# Patient Record
Sex: Male | Born: 1975 | Race: Black or African American | Hispanic: No | Smoking: Current every day smoker
Health system: Southern US, Community
[De-identification: ages and names within clinical notes are randomized; demographics above are authoritative.]

## PROBLEM LIST (undated history)

## (undated) DIAGNOSIS — E119 Type 2 diabetes mellitus without complications: Secondary | ICD-10-CM

## (undated) DIAGNOSIS — Z9109 Other allergy status, other than to drugs and biological substances: Secondary | ICD-10-CM

## (undated) DIAGNOSIS — R51 Headache: Secondary | ICD-10-CM

## (undated) DIAGNOSIS — R519 Headache, unspecified: Secondary | ICD-10-CM

## (undated) HISTORY — DX: Type 2 diabetes mellitus without complications: E11.9

## (undated) HISTORY — PX: HAND SURGERY: SHX662

---

## 1999-02-01 ENCOUNTER — Encounter: Payer: Self-pay | Admitting: Emergency Medicine

## 1999-02-01 ENCOUNTER — Emergency Department (HOSPITAL_COMMUNITY): Admission: EM | Admit: 1999-02-01 | Discharge: 1999-02-01 | Payer: Self-pay | Admitting: Emergency Medicine

## 1999-02-15 ENCOUNTER — Emergency Department (HOSPITAL_COMMUNITY): Admission: EM | Admit: 1999-02-15 | Discharge: 1999-02-15 | Payer: Self-pay | Admitting: Emergency Medicine

## 1999-02-15 ENCOUNTER — Encounter: Payer: Self-pay | Admitting: Emergency Medicine

## 1999-02-27 ENCOUNTER — Emergency Department (HOSPITAL_COMMUNITY): Admission: EM | Admit: 1999-02-27 | Discharge: 1999-02-27 | Payer: Self-pay | Admitting: Emergency Medicine

## 1999-05-08 ENCOUNTER — Emergency Department (HOSPITAL_COMMUNITY): Admission: EM | Admit: 1999-05-08 | Discharge: 1999-05-08 | Payer: Self-pay | Admitting: Emergency Medicine

## 1999-07-10 ENCOUNTER — Emergency Department (HOSPITAL_COMMUNITY): Admission: EM | Admit: 1999-07-10 | Discharge: 1999-07-10 | Payer: Self-pay | Admitting: Emergency Medicine

## 1999-08-26 ENCOUNTER — Emergency Department (HOSPITAL_COMMUNITY): Admission: EM | Admit: 1999-08-26 | Discharge: 1999-08-26 | Payer: Self-pay | Admitting: Emergency Medicine

## 1999-11-25 ENCOUNTER — Emergency Department (HOSPITAL_COMMUNITY): Admission: EM | Admit: 1999-11-25 | Discharge: 1999-11-25 | Payer: Self-pay | Admitting: Emergency Medicine

## 2000-06-27 ENCOUNTER — Emergency Department (HOSPITAL_COMMUNITY): Admission: EM | Admit: 2000-06-27 | Discharge: 2000-06-27 | Payer: Self-pay | Admitting: Emergency Medicine

## 2000-06-27 ENCOUNTER — Encounter: Payer: Self-pay | Admitting: Emergency Medicine

## 2000-10-17 ENCOUNTER — Encounter: Payer: Self-pay | Admitting: Emergency Medicine

## 2000-10-17 ENCOUNTER — Emergency Department (HOSPITAL_COMMUNITY): Admission: EM | Admit: 2000-10-17 | Discharge: 2000-10-18 | Payer: Self-pay | Admitting: *Deleted

## 2000-10-20 ENCOUNTER — Ambulatory Visit (HOSPITAL_BASED_OUTPATIENT_CLINIC_OR_DEPARTMENT_OTHER): Admission: RE | Admit: 2000-10-20 | Discharge: 2000-10-20 | Payer: Self-pay | Admitting: *Deleted

## 2000-12-25 ENCOUNTER — Emergency Department (HOSPITAL_COMMUNITY): Admission: EM | Admit: 2000-12-25 | Discharge: 2000-12-25 | Payer: Self-pay | Admitting: Emergency Medicine

## 2001-07-17 ENCOUNTER — Emergency Department (HOSPITAL_COMMUNITY): Admission: EM | Admit: 2001-07-17 | Discharge: 2001-07-17 | Payer: Self-pay | Admitting: *Deleted

## 2001-09-19 ENCOUNTER — Emergency Department (HOSPITAL_COMMUNITY): Admission: EM | Admit: 2001-09-19 | Discharge: 2001-09-19 | Payer: Self-pay | Admitting: Emergency Medicine

## 2002-03-24 ENCOUNTER — Emergency Department (HOSPITAL_COMMUNITY): Admission: EM | Admit: 2002-03-24 | Discharge: 2002-03-24 | Payer: Self-pay | Admitting: Emergency Medicine

## 2002-03-27 ENCOUNTER — Encounter: Payer: Self-pay | Admitting: Emergency Medicine

## 2002-03-27 ENCOUNTER — Emergency Department (HOSPITAL_COMMUNITY): Admission: EM | Admit: 2002-03-27 | Discharge: 2002-03-27 | Payer: Self-pay | Admitting: *Deleted

## 2002-07-08 ENCOUNTER — Emergency Department (HOSPITAL_COMMUNITY): Admission: EM | Admit: 2002-07-08 | Discharge: 2002-07-08 | Payer: Self-pay | Admitting: Emergency Medicine

## 2003-01-06 ENCOUNTER — Emergency Department (HOSPITAL_COMMUNITY): Admission: EM | Admit: 2003-01-06 | Discharge: 2003-01-06 | Payer: Self-pay | Admitting: Emergency Medicine

## 2003-01-06 ENCOUNTER — Encounter: Payer: Self-pay | Admitting: Emergency Medicine

## 2003-04-09 ENCOUNTER — Encounter: Payer: Self-pay | Admitting: Emergency Medicine

## 2003-04-09 ENCOUNTER — Emergency Department (HOSPITAL_COMMUNITY): Admission: EM | Admit: 2003-04-09 | Discharge: 2003-04-09 | Payer: Self-pay | Admitting: Emergency Medicine

## 2003-04-26 ENCOUNTER — Emergency Department (HOSPITAL_COMMUNITY): Admission: EM | Admit: 2003-04-26 | Discharge: 2003-04-26 | Payer: Self-pay | Admitting: Emergency Medicine

## 2004-03-24 ENCOUNTER — Emergency Department (HOSPITAL_COMMUNITY): Admission: EM | Admit: 2004-03-24 | Discharge: 2004-03-24 | Payer: Self-pay | Admitting: Emergency Medicine

## 2004-07-25 ENCOUNTER — Emergency Department (HOSPITAL_COMMUNITY): Admission: EM | Admit: 2004-07-25 | Discharge: 2004-07-25 | Payer: Self-pay | Admitting: *Deleted

## 2005-02-02 ENCOUNTER — Emergency Department (HOSPITAL_COMMUNITY): Admission: EM | Admit: 2005-02-02 | Discharge: 2005-02-02 | Payer: Self-pay | Admitting: Emergency Medicine

## 2005-03-05 ENCOUNTER — Emergency Department (HOSPITAL_COMMUNITY): Admission: EM | Admit: 2005-03-05 | Discharge: 2005-03-05 | Payer: Self-pay | Admitting: Emergency Medicine

## 2007-03-06 ENCOUNTER — Emergency Department (HOSPITAL_COMMUNITY): Admission: EM | Admit: 2007-03-06 | Discharge: 2007-03-07 | Payer: Self-pay | Admitting: Emergency Medicine

## 2007-12-25 ENCOUNTER — Emergency Department (HOSPITAL_COMMUNITY): Admission: EM | Admit: 2007-12-25 | Discharge: 2007-12-25 | Payer: Self-pay | Admitting: Emergency Medicine

## 2009-03-12 ENCOUNTER — Emergency Department (HOSPITAL_COMMUNITY): Admission: EM | Admit: 2009-03-12 | Discharge: 2009-03-12 | Payer: Self-pay | Admitting: Emergency Medicine

## 2010-01-17 ENCOUNTER — Emergency Department (HOSPITAL_COMMUNITY): Admission: EM | Admit: 2010-01-17 | Discharge: 2010-01-17 | Payer: Self-pay | Admitting: Family Medicine

## 2010-01-22 ENCOUNTER — Emergency Department (HOSPITAL_COMMUNITY): Admission: EM | Admit: 2010-01-22 | Discharge: 2010-01-23 | Payer: Self-pay | Admitting: Emergency Medicine

## 2010-10-23 ENCOUNTER — Emergency Department (HOSPITAL_COMMUNITY)
Admission: EM | Admit: 2010-10-23 | Discharge: 2010-10-23 | Payer: Self-pay | Source: Home / Self Care | Admitting: Emergency Medicine

## 2011-04-12 NOTE — Op Note (Signed)
[place]  Patient:    Herbert Barnes, Herbert Barnes                     MRN: [id number] Proc. Date: 10/20/00 Adm. Date:  [id number] Attending:  Kendell Bane                           Operative Report  PREOPERATIVE DIAGNOSIS:  Laceration, extensor tendons, right ring and small fingers, dorsum of right hand.  POSTOPERATIVE DIAGNOSIS:  Laceration, extensor tendons, right ring and small fingers, dorsum of right hand.  OPERATION:  Repair of extensor tendons, right ring and small fingers, dorsum of right hand.  SURGEON:  Lowell Bouton, M.D.  ANESTHESIA:  General.  OPERATIVE FINDINGS:  The patient had a complete transection of the two extensor tendons to the small finger and one of the extensor tendons to the right finger.  The wound was grossly contaminated with fiberglass materials from a wall that he struck.  The periosteum was lacerated on the small finger metacarpal with a dorsal crack in the bone.  DESCRIPTION OF PROCEDURE:  Under general anesthesia with the tourniquet on the right arm, the right hand was prepped and draped in the usual fashion.  After exsanguinating the limb, the tourniquet was inflated to 225 mmHg.  The previous sutures were removed, and the wound edges were debrided.  The wound was extended proximally in a zigzag fashion and flap elevated radially.  The wound was contaminated with little white flecks of fiberglass from the wall, and this was debrided meticulously.  The wound was irrigated copiously with saline.  The dorsal periosteum on the fifth metacarpal was disrupted and was repaired with a 3-0 Ethibond suture.  There was a small cortical crack in the fifth metacarpal dorsally.  The fracture was not unstable.  The extensor tendons were identified proximally and distally, and the two extensor tendons to the small finger were repaired with a 3-0 Ethibond in a Kessler fashion, and the ulnar-most extensor  tendon of the ring finger was repaired with 3-0 Ethibond in a Kessler fashion.  The repairs were reinforced with simple sutures using Ethibond.  The wound was again copiously irrigated.  Bleeding was controlled with electrocautery.  The skin was closed over a vessel loop drain using a 4-0 nylon suture.  Sterile dressings were applied followed by a volar wrist splint with the ulnar two digits protected.  The patient tolerated the procedure well and went to the recovery room awake, stable, and in good condition. DD:  10/20/00 TD:  10/20/00 Job: [id number]

## 2011-11-26 ENCOUNTER — Emergency Department (HOSPITAL_COMMUNITY)
Admission: EM | Admit: 2011-11-26 | Discharge: 2011-11-26 | Disposition: A | Discharge status: Home / Self Care | Payer: Self-pay | Attending: Emergency Medicine | Admitting: Emergency Medicine

## 2011-11-26 ENCOUNTER — Encounter: Payer: Self-pay | Admitting: Emergency Medicine

## 2011-11-26 DIAGNOSIS — R220 Localized swelling, mass and lump, head: Secondary | ICD-10-CM | POA: Insufficient documentation

## 2011-11-26 DIAGNOSIS — F17200 Nicotine dependence, unspecified, uncomplicated: Secondary | ICD-10-CM | POA: Insufficient documentation

## 2011-11-26 DIAGNOSIS — K1370 Unspecified lesions of oral mucosa: Secondary | ICD-10-CM | POA: Insufficient documentation

## 2011-11-26 DIAGNOSIS — K029 Dental caries, unspecified: Secondary | ICD-10-CM | POA: Insufficient documentation

## 2011-11-26 DIAGNOSIS — K089 Disorder of teeth and supporting structures, unspecified: Secondary | ICD-10-CM | POA: Insufficient documentation

## 2011-11-26 MED ORDER — HYDROCODONE-ACETAMINOPHEN 5-500 MG PO TABS: 1.0000 | ORAL_TABLET | Freq: Four times a day (QID) | ORAL | Status: AC | PRN

## 2011-11-26 MED ORDER — PENICILLIN V POTASSIUM 500 MG PO TABS: 500.0000 mg | ORAL_TABLET | Freq: Four times a day (QID) | ORAL | Status: AC

## 2011-11-26 NOTE — ED Notes (Signed)
Per Pt: tooth in left lower jaw has massive carie and severe pain; part of tooth chip away 3-4 months ago, pt has not seen a dentist, pt has no signs of infection that pt has noticed (no swelling, no fever); pt has been using ibuprofen and orajel without relief. 10/10 pain reported, worse with eating.

## 2011-11-26 NOTE — ED Provider Notes (Signed)
History     CSN: [id number]  Arrival date & time 11/26/11  1609   First MD Initiated Contact with Patient 11/26/11 1757      Chief Complaint  Patient presents with  . Dental Pain    LT lower tooth chipped.      Patient is a 36 y.o. male presenting with tooth pain.  Dental PainPrimary symptoms do not include mouth pain, dental injury, oral lesions, headaches, fever, shortness of breath, sore throat or cough.  Additional symptoms do not include: dental sensitivity to temperature, gum swelling, gum tenderness, jaw pain, facial swelling, trouble swallowing, pain with swallowing, dry mouth, taste disturbance, smell disturbance, drooling, ear pain, hearing loss, nosebleeds, swollen glands, goiter and fatigue.    History reviewed. No pertinent past medical history.  Past Surgical History  Procedure Date  . Hand surgery     History reviewed. No pertinent family history.  History  Substance Use Topics  . Smoking status: Current Everyday Smoker -- 1.5 packs/day  . Smokeless tobacco: Not on file  . Alcohol Use: Yes     ocassional      Review of Systems  Constitutional: Negative for fever, chills, diaphoresis, appetite change and fatigue.  HENT: Negative for hearing loss, ear pain, nosebleeds, sore throat, facial swelling, drooling, trouble swallowing and neck pain.   Eyes: Negative for photophobia and visual disturbance.  Respiratory: Negative for cough, chest tightness and shortness of breath.   Cardiovascular: Negative for chest pain.  Gastrointestinal: Negative for nausea, vomiting and abdominal pain.  Genitourinary: Negative for flank pain.  Musculoskeletal: Negative for back pain.  Skin: Negative for rash.  Neurological: Negative for weakness, numbness and headaches.  All other systems reviewed and are negative.    Allergies  Review of patient's allergies indicates no known allergies.  Home Medications   Current Outpatient Rx  Name Route Sig Dispense Refill  .  ACETAMINOPHEN 500 MG PO TABS Oral Take 1,500 mg by mouth every 6 (six) hours as needed. pain     . PSEUDOEPHEDRINE HCL 30 MG PO TABS Oral Take 30 mg by mouth every 4 (four) hours as needed. Pressure and pain....        BP 119/77  Pulse 65  Temp(Src) 98.5 F (36.9 C) (Oral)  Resp 18  SpO2 100%  Physical Exam  Nursing note and vitals reviewed. Constitutional: He is oriented to person, place, and time. He appears well-developed and well-nourished. No distress.  HENT:  Head: Normocephalic and atraumatic. No trismus in the jaw.  Mouth/Throat: Oropharynx is clear and moist. Oral lesions present. Dental caries present. No dental abscesses or uvula swelling.       No swelling under the tongue. No abscess. Dental caries noted to the left second molar lower.  Eyes: EOM are normal. Pupils are equal, round, and reactive to light.  Neck: Normal range of motion. Neck supple.  Cardiovascular: Normal rate, regular rhythm, normal heart sounds and intact distal pulses.  Exam reveals no gallop and no friction rub.   No murmur heard. Pulmonary/Chest: Effort normal and breath sounds normal. No respiratory distress. He has no wheezes. He exhibits no tenderness.  Abdominal: Soft. Bowel sounds are normal. There is no tenderness. There is no rebound and no guarding.  Musculoskeletal: Normal range of motion. He exhibits no edema and no tenderness.  Neurological: He is alert and oriented to person, place, and time. He displays normal reflexes. No cranial nerve deficit or sensory deficit. He exhibits normal muscle tone. He displays a  negative Romberg sign. Coordination and gait normal. GCS eye subscore is 4. GCS verbal subscore is 5. GCS motor subscore is 6. He displays no Babinski's sign on the right side. He displays no Babinski's sign on the left side.       Normal finger to nose testing. Normal grip. No pronator drift. No nystagmus on examination.  Skin: Skin is warm and dry. No rash noted. He is not  diaphoretic. No erythema. No pallor.  Psychiatric: He has a normal mood and affect. His behavior is normal. Judgment and thought content normal.    ED Course  Procedures (including critical care time)  Patient seen and evaluated.  VSS reviewed. . Nursing notes reviewed.  Initial testing ordered. Will monitor the patient closely. They agree with the treatment plan and diagnosis.    MDM  Dental pain Dental caries, no swelling. No trismus. No concern for deeper infection at this time. Advised patient of warning signs to return. Stated agreement and understanding.   Demetrius Charity, PA 11/26/11 [phone]

## 2011-11-27 NOTE — ED Provider Notes (Signed)
Medical screening examination/treatment/procedure(s) were performed by non-physician practitioner and as supervising physician I was immediately available for consultation/collaboration.   Lyanne Co, MD 11/27/11 [phone]

## 2015-03-08 ENCOUNTER — Encounter (HOSPITAL_COMMUNITY): Payer: Self-pay | Admitting: Emergency Medicine

## 2015-03-08 ENCOUNTER — Inpatient Hospital Stay (HOSPITAL_COMMUNITY)
Admission: EM | Admit: 2015-03-08 | Discharge: 2015-03-09 | DRG: 637 | Disposition: A | Discharge status: Home / Self Care | Payer: Self-pay | Attending: Internal Medicine | Admitting: Internal Medicine

## 2015-03-08 ENCOUNTER — Emergency Department (HOSPITAL_COMMUNITY): Payer: Self-pay

## 2015-03-08 DIAGNOSIS — F17210 Nicotine dependence, cigarettes, uncomplicated: Secondary | ICD-10-CM | POA: Diagnosis present

## 2015-03-08 DIAGNOSIS — R739 Hyperglycemia, unspecified: Secondary | ICD-10-CM | POA: Diagnosis present

## 2015-03-08 DIAGNOSIS — R410 Disorientation, unspecified: Secondary | ICD-10-CM

## 2015-03-08 DIAGNOSIS — R55 Syncope and collapse: Secondary | ICD-10-CM | POA: Diagnosis present

## 2015-03-08 DIAGNOSIS — E119 Type 2 diabetes mellitus without complications: Secondary | ICD-10-CM | POA: Insufficient documentation

## 2015-03-08 DIAGNOSIS — E1165 Type 2 diabetes mellitus with hyperglycemia: Principal | ICD-10-CM | POA: Diagnosis present

## 2015-03-08 DIAGNOSIS — Z833 Family history of diabetes mellitus: Secondary | ICD-10-CM

## 2015-03-08 DIAGNOSIS — R05 Cough: Secondary | ICD-10-CM

## 2015-03-08 DIAGNOSIS — E860 Dehydration: Secondary | ICD-10-CM | POA: Diagnosis present

## 2015-03-08 DIAGNOSIS — R059 Cough, unspecified: Secondary | ICD-10-CM

## 2015-03-08 DIAGNOSIS — E876 Hypokalemia: Secondary | ICD-10-CM | POA: Diagnosis present

## 2015-03-08 DIAGNOSIS — E1100 Type 2 diabetes mellitus with hyperosmolarity without nonketotic hyperglycemic-hyperosmolar coma (NKHHC): Secondary | ICD-10-CM | POA: Diagnosis present

## 2015-03-08 HISTORY — DX: Headache, unspecified: R51.9

## 2015-03-08 HISTORY — DX: Headache: R51

## 2015-03-08 HISTORY — DX: Other allergy status, other than to drugs and biological substances: Z91.09

## 2015-03-08 LAB — BLOOD GAS, VENOUS
ACID-BASE DEFICIT: 2.2 mmol/L — AB (ref 0.0–2.0)
BICARBONATE: 23 meq/L (ref 20.0–24.0)
FIO2: 0.21 %
O2 SAT: 59.5 %
PATIENT TEMPERATURE: 98.6
TCO2: 20.5 mmol/L (ref 0–100)
pCO2, Ven: 42.9 mmHg — ABNORMAL LOW (ref 45.0–50.0)
pH, Ven: 7.348 — ABNORMAL HIGH (ref 7.250–7.300)
pO2, Ven: 30.6 mmHg (ref 30.0–45.0)

## 2015-03-08 LAB — URINE MICROSCOPIC-ADD ON

## 2015-03-08 LAB — CBC WITH DIFFERENTIAL/PLATELET
BASOS ABS: 0 10*3/uL (ref 0.0–0.1)
BASOS PCT: 0 % (ref 0–1)
EOS ABS: 0.1 10*3/uL (ref 0.0–0.7)
EOS PCT: 1 % (ref 0–5)
HCT: 45.1 % (ref 39.0–52.0)
Hemoglobin: 15.4 g/dL (ref 13.0–17.0)
LYMPHS ABS: 4.1 10*3/uL — AB (ref 0.7–4.0)
LYMPHS PCT: 47 % — AB (ref 12–46)
MCH: 28.3 pg (ref 26.0–34.0)
MCHC: 34.1 g/dL (ref 30.0–36.0)
MCV: 82.8 fL (ref 78.0–100.0)
MONO ABS: 0.6 10*3/uL (ref 0.1–1.0)
MONOS PCT: 6 % (ref 3–12)
NEUTROS ABS: 4 10*3/uL (ref 1.7–7.7)
Neutrophils Relative %: 46 % (ref 43–77)
Platelets: 217 10*3/uL (ref 150–400)
RBC: 5.45 MIL/uL (ref 4.22–5.81)
RDW: 13.2 % (ref 11.5–15.5)
WBC: 8.7 10*3/uL (ref 4.0–10.5)

## 2015-03-08 LAB — COMPREHENSIVE METABOLIC PANEL
ALBUMIN: 4.5 g/dL (ref 3.5–5.2)
ALK PHOS: 86 U/L (ref 39–117)
ALT: 17 U/L (ref 0–53)
AST: 23 U/L (ref 0–37)
Anion gap: 14 (ref 5–15)
BILIRUBIN TOTAL: 1.4 mg/dL — AB (ref 0.3–1.2)
BUN: 14 mg/dL (ref 6–23)
CO2: 21 mmol/L (ref 19–32)
Calcium: 9 mg/dL (ref 8.4–10.5)
Chloride: 96 mmol/L (ref 96–112)
Creatinine, Ser: 1.28 mg/dL (ref 0.50–1.35)
GFR calc Af Amer: 81 mL/min — ABNORMAL LOW (ref >90)
GFR calc non Af Amer: 70 mL/min — ABNORMAL LOW (ref >90)
Glucose, Bld: 479 mg/dL — ABNORMAL HIGH (ref 70–99)
Potassium: 4.6 mmol/L (ref 3.5–5.1)
Sodium: 131 mmol/L — ABNORMAL LOW (ref 135–145)
TOTAL PROTEIN: 7.9 g/dL (ref 6.0–8.3)

## 2015-03-08 LAB — URINALYSIS, ROUTINE W REFLEX MICROSCOPIC
Bilirubin Urine: NEGATIVE
HGB URINE DIPSTICK: NEGATIVE
Ketones, ur: 40 mg/dL — AB
Leukocytes, UA: NEGATIVE
Nitrite: NEGATIVE
PROTEIN: NEGATIVE mg/dL
Specific Gravity, Urine: 1.045 — ABNORMAL HIGH (ref 1.005–1.030)
Urobilinogen, UA: 0.2 mg/dL (ref 0.0–1.0)
pH: 5 (ref 5.0–8.0)

## 2015-03-08 LAB — GLUCOSE, CAPILLARY
GLUCOSE-CAPILLARY: 337 mg/dL — AB (ref 70–99)
GLUCOSE-CAPILLARY: 310 mg/dL — AB (ref 70–99)

## 2015-03-08 LAB — CBG MONITORING, ED
GLUCOSE-CAPILLARY: 502 mg/dL — AB (ref 70–99)
Glucose-Capillary: 469 mg/dL — ABNORMAL HIGH (ref 70–99)

## 2015-03-08 LAB — I-STAT TROPONIN, ED: TROPONIN I, POC: 0 ng/mL (ref 0.00–0.08)

## 2015-03-08 MED ORDER — SODIUM CHLORIDE 0.9 % IV BOLUS (SEPSIS)
1000.0000 mL | INTRAVENOUS | Status: AC
  Administered 2015-03-08: 1000 mL via INTRAVENOUS

## 2015-03-08 MED ORDER — ACETAMINOPHEN 650 MG RE SUPP: 650.0000 mg | Freq: Four times a day (QID) | RECTAL | Status: DC | PRN

## 2015-03-08 MED ORDER — SODIUM CHLORIDE 0.9 % IV SOLN
INTRAVENOUS | Status: DC
  Filled 2015-03-08: qty 2.5

## 2015-03-08 MED ORDER — SODIUM CHLORIDE 0.9 % IV SOLN
INTRAVENOUS | Status: DC
  Filled 2015-03-08 (×2): qty 2.5

## 2015-03-08 MED ORDER — SODIUM CHLORIDE 0.9 % IJ SOLN
3.0000 mL | Freq: Two times a day (BID) | INTRAMUSCULAR | Status: DC
  Administered 2015-03-08: 3 mL via INTRAVENOUS

## 2015-03-08 MED ORDER — ACETAMINOPHEN 325 MG PO TABS: 650.0000 mg | ORAL_TABLET | Freq: Four times a day (QID) | ORAL | Status: DC | PRN

## 2015-03-08 MED ORDER — INSULIN REGULAR BOLUS VIA INFUSION
0.0000 [IU] | Freq: Three times a day (TID) | INTRAVENOUS | Status: DC
  Filled 2015-03-08: qty 10

## 2015-03-08 MED ORDER — ASPIRIN EC 81 MG PO TBEC
81.0000 mg | DELAYED_RELEASE_TABLET | Freq: Every day | ORAL | Status: DC
  Administered 2015-03-08 – 2015-03-09 (×2): 81 mg via ORAL
  Filled 2015-03-08 (×2): qty 1

## 2015-03-08 MED ORDER — ONDANSETRON HCL 4 MG PO TABS: 4.0000 mg | ORAL_TABLET | Freq: Four times a day (QID) | ORAL | Status: DC | PRN

## 2015-03-08 MED ORDER — DEXTROSE 50 % IV SOLN: 25.0000 mL | INTRAVENOUS | Status: DC | PRN

## 2015-03-08 MED ORDER — ENOXAPARIN SODIUM 40 MG/0.4ML ~~LOC~~ SOLN
40.0000 mg | SUBCUTANEOUS | Status: DC
  Administered 2015-03-08: 40 mg via SUBCUTANEOUS
  Filled 2015-03-08: qty 0.4

## 2015-03-08 MED ORDER — INSULIN ASPART 100 UNIT/ML ~~LOC~~ SOLN
0.0000 [IU] | SUBCUTANEOUS | Status: DC
  Administered 2015-03-08: 7 [IU] via SUBCUTANEOUS
  Administered 2015-03-09: 3 [IU] via SUBCUTANEOUS
  Administered 2015-03-09: 7 [IU] via SUBCUTANEOUS
  Administered 2015-03-09: 3 [IU] via SUBCUTANEOUS

## 2015-03-08 MED ORDER — INSULIN GLARGINE 100 UNIT/ML ~~LOC~~ SOLN
5.0000 [IU] | Freq: Every day | SUBCUTANEOUS | Status: DC
  Administered 2015-03-08: 5 [IU] via SUBCUTANEOUS
  Filled 2015-03-08: qty 0.05

## 2015-03-08 MED ORDER — SODIUM CHLORIDE 0.9 % IV SOLN
INTRAVENOUS | Status: DC
  Administered 2015-03-08 – 2015-03-09 (×2): via INTRAVENOUS

## 2015-03-08 MED ORDER — DEXTROSE-NACL 5-0.45 % IV SOLN: INTRAVENOUS | Status: DC

## 2015-03-08 MED ORDER — ONDANSETRON HCL 4 MG/2ML IJ SOLN: 4.0000 mg | Freq: Four times a day (QID) | INTRAMUSCULAR | Status: DC | PRN

## 2015-03-08 NOTE — H&P (Signed)
PCP: None   Chief Complaint:  Drowsy and fatigued  HPI: Herbert Barnes is a 39 y.o. male   has a past medical history of Headache and Environmental allergies.   Presented with  1 month hx of increased thirst, increased urination, light-headedness, severe fatigue and muscle cramps.  He has no primary care provider. Family though he maybe borderline diabetic. Patient has family hx of DM and early onset CAD in his mother who died at 63 with massive MI. Yesterday he had drank some alcohol that was unusual for him he reports passing out soon after. Patient felt so fatigued he presented to ER. He was found to have blood glucose 469. He was given IV fluid bolus and Bg was rechecked it wasup to 502. Patient still feels fatigued and drowsy.  Hospitalist was called for admission for hyperglycemia likely new onset diabetes mellitus type 2  Review of Systems:    Pertinent positives include:  Fatigue, increased urination, possible syncope  Constitutional:  No weight loss, night sweats, Fevers, chills,  HEENT:  No headaches, Difficulty swallowing,Tooth/dental problems, Sore throat,  No sneezing, itching, ear ache, nasal congestion, post nasal drip,  Cardio-vascular:  No chest pain, Orthopnea, PND, anasarca, dizziness, palpitations.no Bilateral lower extremity swelling  GI:  No heartburn, indigestion, abdominal pain, nausea, vomiting, diarrhea, change in bowel habits, loss of appetite, melena, blood in stool, hematemesis Resp:  no shortness of breath at rest. No dyspnea on exertion, No excess mucus, no productive cough, No non-productive cough, No coughing up of blood.No change in color of mucus.No wheezing. Skin:  no rash or lesions. No jaundice GU:  no dysuria, change in color of urine, no urgency or frequency. No straining to urinate.  No flank pain.  Musculoskeletal:  No joint pain or no joint swelling. No decreased range of motion. No back pain.  Psych:  No change in mood or  affect. No depression or anxiety. No memory loss.  Neuro: no localizing neurological complaints, no tingling, no weakness, no double vision, no gait abnormality, no slurred speech, no confusion  Otherwise ROS are negative except for above, 10 systems were reviewed  Past Medical History: Past Medical History  Diagnosis Date  . Headache     hx of migraine in past  . Environmental allergies    Past Surgical History  Procedure Laterality Date  . Hand surgery       Medications: Prior to Admission medications   Medication Sig Start Date End Date Taking? Authorizing Provider  ibuprofen (ADVIL,MOTRIN) 200 MG tablet Take 800-1,200 mg by mouth every 6 (six) hours as needed for moderate pain (pain).   Yes Historical Provider, MD  Multiple Vitamins-Minerals (MULTIVITAMIN & MINERAL PO) Take 1 tablet by mouth daily as needed (nutrition).   Yes Historical Provider, MD    Allergies:  No Known Allergies  Social History:  Ambulatory   independently  Lives at home  With family     reports that he has been smoking.  He has never used smokeless tobacco. He reports that he drinks alcohol. He reports that he does not use illicit drugs.    Family History: family history includes CAD in his mother; Diabetes type II in his father.    Physical Exam: Patient Vitals for the past 24 hrs:  BP Temp Temp src Pulse Resp SpO2  03/08/15 1900 134/71 mmHg - - 82 20 96 %  03/08/15 1832 131/70 mmHg - - 102 23 99 %  03/08/15 1830 131/70 mmHg - -  73 17 96 %  03/08/15 1800 133/83 mmHg - - 85 20 99 %  03/08/15 1747 141/68 mmHg - - 88 14 97 %  03/08/15 1720 145/68 mmHg - - 83 14 98 %  03/08/15 1645 146/77 mmHg - - 95 18 94 %  03/08/15 1630 146/77 mmHg - - 96 17 96 %  03/08/15 1607 152/89 mmHg 98.4 F (36.9 C) Oral 102 16 95 %    1. General:  in No Acute distress 2. Psychological: Alert and  Oriented 3. Head/ENT:     Dry Mucous Membranes                          Head Non traumatic, neck supple                           Normal   Dentition 4. SKIN:  decreased Skin turgor,  Skin clean Dry and intact no rash 5. Heart: Regular rate and rhythm no Murmur, Rub or gallop 6. Lungs: Clear to auscultation bilaterally, no wheezes or crackles   7. Abdomen: Soft, non-tender, Non distended 8. Lower extremities: no clubbing, cyanosis, or edema 9. Neurologically Grossly intact, moving all 4 extremities equally 10. MSK: Normal range of motion  body mass index is unknown because there is no height or weight on file.   Labs on Admission:   Results for orders placed or performed during the hospital encounter of 03/08/15 (from the past 24 hour(s))  CBG monitoring, ED     Status: Abnormal   Collection Time: 03/08/15  4:19 PM  Result Value Ref Range   Glucose-Capillary 469 (H) 70 - 99 mg/dL  CBC with Differential     Status: Abnormal   Collection Time: 03/08/15  4:34 PM  Result Value Ref Range   WBC 8.7 4.0 - 10.5 K/uL   RBC 5.45 4.22 - 5.81 MIL/uL   Hemoglobin 15.4 13.0 - 17.0 g/dL   HCT 16.1 09.6 - 04.5 %   MCV 82.8 78.0 - 100.0 fL   MCH 28.3 26.0 - 34.0 pg   MCHC 34.1 30.0 - 36.0 g/dL   RDW 40.9 81.1 - 91.4 %   Platelets 217 150 - 400 K/uL   Neutrophils Relative % 46 43 - 77 %   Neutro Abs 4.0 1.7 - 7.7 K/uL   Lymphocytes Relative 47 (H) 12 - 46 %   Lymphs Abs 4.1 (H) 0.7 - 4.0 K/uL   Monocytes Relative 6 3 - 12 %   Monocytes Absolute 0.6 0.1 - 1.0 K/uL   Eosinophils Relative 1 0 - 5 %   Eosinophils Absolute 0.1 0.0 - 0.7 K/uL   Basophils Relative 0 0 - 1 %   Basophils Absolute 0.0 0.0 - 0.1 K/uL  Comprehensive metabolic panel     Status: Abnormal   Collection Time: 03/08/15  4:34 PM  Result Value Ref Range   Sodium 131 (L) 135 - 145 mmol/L   Potassium 4.6 3.5 - 5.1 mmol/L   Chloride 96 96 - 112 mmol/L   CO2 21 19 - 32 mmol/L   Glucose, Bld 479 (H) 70 - 99 mg/dL   BUN 14 6 - 23 mg/dL   Creatinine, Ser 7.82 0.50 - 1.35 mg/dL   Calcium 9.0 8.4 - 95.6 mg/dL   Total Protein 7.9 6.0 - 8.3  g/dL   Albumin 4.5 3.5 - 5.2 g/dL   AST 23 0 - 37 U/L  ALT 17 0 - 53 U/L   Alkaline Phosphatase 86 39 - 117 U/L   Total Bilirubin 1.4 (H) 0.3 - 1.2 mg/dL   GFR calc non Af Amer 70 (L) >90 mL/min   GFR calc Af Amer 81 (L) >90 mL/min   Anion gap 14 5 - 15  Urinalysis, Routine w reflex microscopic     Status: Abnormal   Collection Time: 03/08/15  5:04 PM  Result Value Ref Range   Color, Urine YELLOW YELLOW   APPearance CLEAR CLEAR   Specific Gravity, Urine 1.045 (H) 1.005 - 1.030   pH 5.0 5.0 - 8.0   Glucose, UA >1000 (A) NEGATIVE mg/dL   Hgb urine dipstick NEGATIVE NEGATIVE   Bilirubin Urine NEGATIVE NEGATIVE   Ketones, ur 40 (A) NEGATIVE mg/dL   Protein, ur NEGATIVE NEGATIVE mg/dL   Urobilinogen, UA 0.2 0.0 - 1.0 mg/dL   Nitrite NEGATIVE NEGATIVE   Leukocytes, UA NEGATIVE NEGATIVE  Urine microscopic-add on     Status: None   Collection Time: 03/08/15  5:04 PM  Result Value Ref Range   Squamous Epithelial / LPF RARE RARE   WBC, UA 0-2 <3 WBC/hpf   Bacteria, UA RARE RARE  CBG monitoring, ED     Status: Abnormal   Collection Time: 03/08/15  5:56 PM  Result Value Ref Range   Glucose-Capillary 502 (H) 70 - 99 mg/dL  Blood gas, venous     Status: Abnormal   Collection Time: 03/08/15  6:02 PM  Result Value Ref Range   FIO2 0.21 %   Delivery systems ROOM AIR    pH, Ven 7.348 (H) 7.250 - 7.300   pCO2, Ven 42.9 (L) 45.0 - 50.0 mmHg   pO2, Ven 30.6 30.0 - 45.0 mmHg   Bicarbonate 23.0 20.0 - 24.0 mEq/L   TCO2 20.5 0 - 100 mmol/L   Acid-base deficit 2.2 (H) 0.0 - 2.0 mmol/L   O2 Saturation 59.5 %   Patient temperature 98.6    Collection site VEIN    Drawn by DRAWN BY RN    Sample type VEIN     UA glucose >1000  No results found for: HGBA1C  CrCl cannot be calculated (Unknown ideal weight.).  BNP (last 3 results) No results for input(s): PROBNP in the last 8760 hours.  Other results:  I have pearsonaly reviewed this: ECG REPORT  Rate: 101  Rhythm: ST ST&T Change:  no ischemic changes   There were no vitals filed for this visit.   Cultures: No results found for: SDES, SPECREQUEST, CULT, REPTSTATUS   Radiological Exams on Admission: Dg Chest 2 View  03/08/2015   CLINICAL DATA:  Bodies, dizziness, fatigue, lethargy, confusion, frequent urination, smoker, polydipsia, symptoms for 1 day  EXAM: CHEST  2 VIEW  COMPARISON:  01/22/2010  FINDINGS: Upper normal heart size.  Mediastinal contours and pulmonary vascularity normal.  Lungs clear.  No pleural effusion or pneumothorax.  Osseous structures unremarkable.  IMPRESSION: No acute abnormalities.   Electronically Signed   By: Ulyses Southward M.D.   On: 03/08/2015 17:43   Ct Head Wo Contrast  03/08/2015   CLINICAL DATA:  Body aches, dizziness and fatigue  EXAM: CT HEAD WITHOUT CONTRAST  TECHNIQUE: Contiguous axial images were obtained from the base of the skull through the vertex without intravenous contrast.  COMPARISON:  None.  FINDINGS: No acute cortical infarct, hemorrhage, or mass lesion ispresent. Ventricles are of normal size. No significant extra-axial fluid collection is present. The paranasal sinuses andmastoid  air cells are clear. The osseous skull is intact.  IMPRESSION: 1. Normal brain.   Electronically Signed   By: Signa Kellaylor  Stroud M.D.   On: 03/08/2015 17:13    Chart has been reviewed  Assessment/Plan  39 year old gentleman with no significant prior history presents with symptoms consistent with new onset diabetes mellitus and hyperglycemia  Present on Admission:  . Syncope - happen in the setting of alcohol intake and dehydration. Will admit to telemetry cycle cardiac enzymes obtain echogram given risk factorsof strong family history of early onset coronary artery disease and tobacco abuse. We'll check orthostatics  . DM hyperosmolarity type II - initiated glucose stabilizer, diabetes education, patient will need to establish care with PCP after discharge for further diabetes management. Given severe  hyperglycemia he may benefit from insulin as an outpatient in addition to oral regimen  . Hyperglycemia - initiated glucose stabilizer, rehydrate Tobacco abuse- spoke to patient regarding importance of quitting  Prophylaxis:   Lovenox  CODE STATUS:  FULL CODE     Other plan as per orders.  I have spent a total of 55 min on this admission  Allysa Governale 03/08/2015, 7:16 PM  [facility]  Pager [phone]   after 2 AM please page floor coverage PA If 7AM-7PM, please contact the day team taking care of the patient  [email or web address]  Password [email or web address]

## 2015-03-08 NOTE — Progress Notes (Addendum)
CM spoke with [place]. CM discussed and provided written information for self pay pcps, importance of pcp for f/u care, www.needymeds.org, www.goodrx.com, discounted pharmacies and other [place] resources such as [facility], [facility], affordable care act,  financial assistance, DSS and  health department  Reviewed resources for [place] self pay pcps like [facility], family medicine at [address], [facility], general medical clinics, [facility] plus others, medication resources, [facility] out patient pharmacies and housing Pt voiced understanding and appreciation of resources provided  Male at bedside states pt has been living with he for 4 years Pt noted to dozed in and out during Cm interaction with him  Provided [facility] contact information [facility] because she had a staff member to [facility] to get the orange card  Cm completed referral

## 2015-03-08 NOTE — ED Notes (Signed)
Patient is still in xray 

## 2015-03-08 NOTE — ED Notes (Signed)
Pt c/o body aches, dizziness, fatigue, frequent urination, lethargy, confusion, polydipsia onset yesterday. Pt oriented x 4, drowsy.

## 2015-03-08 NOTE — ED Provider Notes (Signed)
CSN: [id number]     Arrival date & time 03/08/15  1555 History   First MD Initiated Contact with Patient 03/08/15 1614     Chief Complaint  Patient presents with  . Altered Mental Status     (Consider location/radiation/quality/duration/timing/severity/associated sxs/prior Treatment) Patient is a 39 y.o. male presenting with altered mental status. The history is provided by the patient.  Altered Mental Status Presenting symptoms: confusion   Severity:  Mild Most recent episode:  2 days ago Episode history:  Continuous Timing:  Constant Progression:  Worsening Chronicity:  New Context comment:  Elevated BS found here Associated symptoms: headaches and weakness (generalized)   Associated symptoms: no abdominal pain, no fever, no nausea and no vomiting     History reviewed. No pertinent past medical history. Past Surgical History  Procedure Laterality Date  . Hand surgery     History reviewed. No pertinent family history. History  Substance Use Topics  . Smoking status: Current Every Day Smoker -- 1.50 packs/day  . Smokeless tobacco: Not on file  . Alcohol Use: Yes     Comment: ocassional    Review of Systems  Constitutional: Positive for fatigue. Negative for fever.  HENT: Negative for drooling and rhinorrhea.   Eyes: Negative for pain.  Respiratory: Positive for cough (mild). Negative for shortness of breath.   Cardiovascular: Negative for chest pain and leg swelling.  Gastrointestinal: Negative for nausea, vomiting, abdominal pain and diarrhea.  Endocrine: Positive for polydipsia and polyuria.  Genitourinary: Positive for frequency. Negative for dysuria and hematuria.  Musculoskeletal: Positive for myalgias and arthralgias. Negative for gait problem and neck pain.  Skin: Negative for color change.  Neurological: Positive for dizziness, weakness (generalized) and headaches. Negative for numbness.  Hematological: Negative for adenopathy.  Psychiatric/Behavioral:  Positive for confusion. Negative for behavioral problems.  All other systems reviewed and are negative.     Allergies  Review of patient's allergies indicates no known allergies.  Home Medications   Prior to Admission medications   Medication Sig Start Date End Date Taking? Authorizing Provider  acetaminophen (TYLENOL) 500 MG tablet Take 1,500 mg by mouth every 6 (six) hours as needed. pain     Historical Provider, MD  pseudoephedrine (SUDAFED) 30 MG tablet Take 30 mg by mouth every 4 (four) hours as needed. Pressure and pain....      Historical Provider, MD   BP 152/89 mmHg  Pulse 102  Temp(Src) 98.4 F (36.9 C) (Oral)  Resp 16  SpO2 95% Physical Exam  Constitutional: He is oriented to person, place, and time. He appears well-developed and well-nourished.  HENT:  Head: Normocephalic and atraumatic.  Right Ear: External ear normal.  Left Ear: External ear normal.  Nose: Nose normal.  Mouth/Throat: Oropharynx is clear and moist. No oropharyngeal exudate.  Eyes: Conjunctivae and EOM are normal. Pupils are equal, round, and reactive to light.  Neck: Normal range of motion. Neck supple.  Cardiovascular: Normal rate, regular rhythm, normal heart sounds and intact distal pulses.  Exam reveals no gallop and no friction rub.   No murmur heard. Pulmonary/Chest: Effort normal and breath sounds normal. No respiratory distress. He has no wheezes.  Abdominal: Soft. Bowel sounds are normal. He exhibits no distension. There is no tenderness. There is no rebound and no guarding.  Musculoskeletal: Normal range of motion. He exhibits no edema or tenderness.  Neurological: He is alert and oriented to person, place, and time.  alert, oriented x3, appears drowsy speech: normal in context and clarity  memory: intact grossly cranial nerves II-XII: intact motor strength: full proximally and distally no involuntary movements or tremors sensation: intact to light touch diffusely  cerebellar:  finger-to-nose and heel-to-shin intact gait: dizziness upon standing  Skin: Skin is warm and dry.  Psychiatric: He has a normal mood and affect.  Nursing note and vitals reviewed.   ED Course  Procedures (including critical care time) Labs Review Labs Reviewed  CBC WITH DIFFERENTIAL/PLATELET - Abnormal; Notable for the following:    Lymphocytes Relative 47 (*)    Lymphs Abs 4.1 (*)    All other components within normal limits  COMPREHENSIVE METABOLIC PANEL - Abnormal; Notable for the following:    Sodium 131 (*)    Glucose, Bld 479 (*)    Total Bilirubin 1.4 (*)    GFR calc non Af Amer 70 (*)    GFR calc Af Amer 81 (*)    All other components within normal limits  URINALYSIS, ROUTINE W REFLEX MICROSCOPIC - Abnormal; Notable for the following:    Specific Gravity, Urine 1.045 (*)    Glucose, UA >1000 (*)    Ketones, ur 40 (*)    All other components within normal limits  BLOOD GAS, VENOUS - Abnormal; Notable for the following:    pH, Ven 7.348 (*)    pCO2, Ven 42.9 (*)    Acid-base deficit 2.2 (*)    All other components within normal limits  GLUCOSE, CAPILLARY - Abnormal; Notable for the following:    Glucose-Capillary 337 (*)    All other components within normal limits  GLUCOSE, CAPILLARY - Abnormal; Notable for the following:    Glucose-Capillary 310 (*)    All other components within normal limits  CBG MONITORING, ED - Abnormal; Notable for the following:    Glucose-Capillary 469 (*)    All other components within normal limits  CBG MONITORING, ED - Abnormal; Notable for the following:    Glucose-Capillary 502 (*)    All other components within normal limits  URINE MICROSCOPIC-ADD ON  MAGNESIUM  PHOSPHORUS  TSH  COMPREHENSIVE METABOLIC PANEL  CBC  TROPONIN I  TROPONIN I  HEMOGLOBIN A1C  TROPONIN I  I-STAT TROPOININ, ED    Imaging Review Dg Chest 2 View  03/08/2015   CLINICAL DATA:  Bodies, dizziness, fatigue, lethargy, confusion, frequent urination,  smoker, polydipsia, symptoms for 1 day  EXAM: CHEST  2 VIEW  COMPARISON:  01/22/2010  FINDINGS: Upper normal heart size.  Mediastinal contours and pulmonary vascularity normal.  Lungs clear.  No pleural effusion or pneumothorax.  Osseous structures unremarkable.  IMPRESSION: No acute abnormalities.   Electronically Signed   By: Ulyses Southward M.D.   On: 03/08/2015 17:43   Ct Head Wo Contrast  03/08/2015   CLINICAL DATA:  Body aches, dizziness and fatigue  EXAM: CT HEAD WITHOUT CONTRAST  TECHNIQUE: Contiguous axial images were obtained from the base of the skull through the vertex without intravenous contrast.  COMPARISON:  None.  FINDINGS: No acute cortical infarct, hemorrhage, or mass lesion ispresent. Ventricles are of normal size. No significant extra-axial fluid collection is present. The paranasal sinuses andmastoid air cells are clear. The osseous skull is intact.  IMPRESSION: 1. Normal brain.   Electronically Signed   By: Signa Kell M.D.   On: 03/08/2015 17:13     EKG Interpretation   Date/Time:  Wednesday March 08 2015 16:18:11 EDT Ventricular Rate:  101 PR Interval:  158 QRS Duration: 82 QT Interval:  342 QTC Calculation: 443  R Axis:   91 Text Interpretation:  Sinus tachycardia Borderline right axis deviation  Consider left ventricular hypertrophy Baseline wander in lead(s) II III  aVF Confirmed by Nicolus Ose  MD, Rebekah Sprinkle ([id number]) on 03/08/2015 4:33:20 PM      MDM   Final diagnoses:  Cough  Confusion  Cough  Confusion    4:33 PM 39 y.o. male with history of borderline diabetes who presents with multiple complaints. He notes that he has had muscle cramps, fatigue, dizziness intermittently over the last month. He has also had polydipsia and polyuria. His spouse notes that he has developed confusion and drowsiness over the last 1-2 days progressively. She notes that he had a 40 ounce alcoholic beverage yesterday. He does not drink regularly or use drugs. She denies any fevers. He  does have a history of intermittent headaches over the last few months. He is afebrile and mildly tachycardic here. Vital signs otherwise unremarkable. He denies any pain on exam. Patient had normal neurologic exam initially with the exception of gait which was deferred due to dizziness upon standing. Patient found to have blood sugar 469. Symptoms likely related to new onset diabetes and DKA.  Labs not consistent with DKA but the patient's blood sugar has gone up after IV fluids. Given his drowsiness, confusion, and syncope yesterday I think it is reasonable to admit the patient for treatment of his hyperglycemia and diabetes education.    Purvis SheffieldForrest Sherrica Niehaus, MD 03/08/15 [phone]

## 2015-03-08 NOTE — ED Notes (Signed)
Patient is in xray.  I will collect labs when he return.

## 2015-03-08 NOTE — ED Notes (Signed)
Pt to CT

## 2015-03-09 DIAGNOSIS — E119 Type 2 diabetes mellitus without complications: Secondary | ICD-10-CM | POA: Insufficient documentation

## 2015-03-09 DIAGNOSIS — R55 Syncope and collapse: Secondary | ICD-10-CM | POA: Insufficient documentation

## 2015-03-09 DIAGNOSIS — E876 Hypokalemia: Secondary | ICD-10-CM

## 2015-03-09 LAB — COMPREHENSIVE METABOLIC PANEL
ALBUMIN: 3.4 g/dL — AB (ref 3.5–5.2)
ALK PHOS: 65 U/L (ref 39–117)
ALT: 17 U/L (ref 0–53)
ANION GAP: 7 (ref 5–15)
AST: 14 U/L (ref 0–37)
BUN: 12 mg/dL (ref 6–23)
CO2: 21 mmol/L (ref 19–32)
Calcium: 7.8 mg/dL — ABNORMAL LOW (ref 8.4–10.5)
Chloride: 104 mmol/L (ref 96–112)
Creatinine, Ser: 0.95 mg/dL (ref 0.50–1.35)
GFR calc Af Amer: >90 mL/min (ref >90)
GFR calc non Af Amer: >90 mL/min (ref >90)
Glucose, Bld: 235 mg/dL — ABNORMAL HIGH (ref 70–99)
Potassium: 3.4 mmol/L — ABNORMAL LOW (ref 3.5–5.1)
SODIUM: 132 mmol/L — AB (ref 135–145)
TOTAL PROTEIN: 6.2 g/dL (ref 6.0–8.3)
Total Bilirubin: 0.5 mg/dL (ref 0.3–1.2)

## 2015-03-09 LAB — TROPONIN I: Troponin I: <0.03 ng/mL (ref <0.031)

## 2015-03-09 LAB — GLUCOSE, CAPILLARY
GLUCOSE-CAPILLARY: 247 mg/dL — AB (ref 70–99)
Glucose-Capillary: 262 mg/dL — ABNORMAL HIGH (ref 70–99)
Glucose-Capillary: 206 mg/dL — ABNORMAL HIGH (ref 70–99)
Glucose-Capillary: 310 mg/dL — ABNORMAL HIGH (ref 70–99)

## 2015-03-09 LAB — CBC
HCT: 38.7 % — ABNORMAL LOW (ref 39.0–52.0)
Hemoglobin: 13.1 g/dL (ref 13.0–17.0)
MCH: 28.1 pg (ref 26.0–34.0)
MCHC: 33.9 g/dL (ref 30.0–36.0)
MCV: 82.9 fL (ref 78.0–100.0)
PLATELETS: 174 10*3/uL (ref 150–400)
RBC: 4.67 MIL/uL (ref 4.22–5.81)
RDW: 13.3 % (ref 11.5–15.5)
WBC: 6.9 10*3/uL (ref 4.0–10.5)

## 2015-03-09 LAB — PHOSPHORUS: Phosphorus: 2.6 mg/dL (ref 2.3–4.6)

## 2015-03-09 LAB — MAGNESIUM: Magnesium: 2 mg/dL (ref 1.5–2.5)

## 2015-03-09 LAB — TSH: TSH: 1.556 u[IU]/mL (ref 0.350–4.500)

## 2015-03-09 MED ORDER — LIVING WELL WITH DIABETES BOOK
Freq: Once | Status: AC
  Administered 2015-03-09: 10:00:00
  Filled 2015-03-09: qty 1

## 2015-03-09 MED ORDER — POTASSIUM CHLORIDE CRYS ER 20 MEQ PO TBCR
40.0000 meq | EXTENDED_RELEASE_TABLET | Freq: Once | ORAL | Status: AC
  Administered 2015-03-09: 40 meq via ORAL
  Filled 2015-03-09: qty 2

## 2015-03-09 MED ORDER — METFORMIN HCL 500 MG PO TABS
500.0000 mg | ORAL_TABLET | Freq: Two times a day (BID) | ORAL | Status: DC
Start: 2015-03-09 — End: 2015-03-14

## 2015-03-09 NOTE — Discharge Summary (Signed)
Physician Discharge Summary  Herbert Barnes L Patil [id number] DOB: June 14, 1976 DOA: 03/08/2015  PCP: No PCP Per Patient  Admit date: 03/08/2015 Discharge date: 03/09/2015  Recommendations for Outpatient Follow-up:  1. Patient will take metformin 500 mg twice daily. He will follow-up with primary care physician per scheduled appointment.  Discharge Diagnoses:  Active Problems:   Near Syncope   DM hyperosmolarity type II   Hyperglycemia    Discharge Condition: stable   Diet recommendation: as tolerated   History of present illness:  39 y.o. male with no significant past medical history who [facility] with worsening symptoms of lightheadedness, polydipsia, polyuria for past 1 month prior to this admission. Patient reports having family history of diabetes, as a matter of fact his father has died from complications of diabetes and MI. Patient reported he felt lightheaded but not losing consciousness. We did not obtain urine drug screen and alcohol on the admission. Patient's blood sugar on the admission was 469. He was given IV fluids and started on sliding scale insulin. His sugars improved without insulin drip.   Hospital Course:   Active Problems: New onset diabetes - A1c is pending. Patient did not require insulin drip on the admission. Patient will be discharged with metformin 500 mg twice daily. - Patient instructed to follow-up with primary care physician per scheduled appointment. - Patient has been seen by diabetic coordinator. Patient was given Lantus during this admission but he prefers to be on pills rather than insulin regimen at least for the beginning. Considering his new diabetic it is reasonable to start by mouth meds for diabetes and then follow-up with primary care physician who will recheck A1c and decide if patient should be on insulin.  Near syncope - Likely secondary to polyuria, polydipsia, new onset diabetes and hyperglycemia. - Patient  feels better this morning. He wants to go home today. - CT head on this admission did not show acute intracranial findings. Chest x-ray with no acute abnormalities.  Hypokalemia - Perhaps from polyuria. Supplemented.  DVT prophylaxis - Lovenox subcutaneous ordered while patient in hospital.   Signed:  Manson PasseyEVINE, Harlan Vinal, MD  [facility] 03/09/2015, 10:20 AM  Pager #: [phone]  Time spent in minutes: less than 30 minutes  Procedures:  None  Consultations:  Diabetic coordinator  Discharge Exam: Filed Vitals:   03/09/15 0446  BP: 140/73  Pulse: 71  Temp: 98.4 F (36.9 C)  Resp: 16   Filed Vitals:   03/08/15 1900 03/08/15 1930 03/08/15 2020 03/09/15 0446  BP: 134/71 142/75 129/79 140/73  Pulse: 82 80 82 71  Temp:   98.8 F (37.1 C) 98.4 F (36.9 C)  TempSrc:   Oral Oral  Resp: 20 17 18 16   Height:   6\' 1"  (1.854 m)   Weight:   102.468 kg (225 lb 14.4 oz)   SpO2: 96% 99% 99% 98%    General: Pt is alert, follows commands appropriately, not in acute distress Cardiovascular: Regular rate and rhythm, S1/S2 +, no murmurs Respiratory: Clear to auscultation bilaterally, no wheezing, no crackles, no rhonchi Abdominal: Soft, non tender, non distended, bowel sounds +, no guarding Extremities: no edema, no cyanosis, pulses palpable bilaterally DP and PT Neuro: Grossly nonfocal  Discharge Instructions  Discharge Instructions    Call MD for:  difficulty breathing, headache or visual disturbances    Complete by:  As directed      Call MD for:  persistant nausea and vomiting    Complete by:  As  directed      Call MD for:  severe uncontrolled pain    Complete by:  As directed      Diet - low sodium heart healthy    Complete by:  As directed      Discharge instructions    Complete by:  As directed   1. Please take metformin 500 mg twice daily for a new onset diabetes. Follow-up with primary care physician per scheduled appointment.     Increase activity slowly     Complete by:  As directed             Medication List    STOP taking these medications        ibuprofen 200 MG tablet  Commonly known as:  ADVIL,MOTRIN      TAKE these medications        metFORMIN 500 MG tablet  Commonly known as:  GLUCOPHAGE  Take 1 tablet (500 mg total) by mouth 2 (two) times daily with a meal.     MULTIVITAMIN & MINERAL PO  Take 1 tablet by mouth daily as needed (nutrition).           Follow-up Information    Follow up with [facility]    . Schedule an appointment as soon as possible for a visit in 1 week.   Why:  Follow up appt after recent hospitalization   Contact information:   [address] [place] [facility] [address] [phone]       The results of significant diagnostics from this hospitalization (including imaging, microbiology, ancillary and laboratory) are listed below for reference.    Significant Diagnostic Studies: Dg Chest 2 View  03/08/2015   CLINICAL DATA:  Bodies, dizziness, fatigue, lethargy, confusion, frequent urination, smoker, polydipsia, symptoms for 1 day  EXAM: CHEST  2 VIEW  COMPARISON:  01/22/2010  FINDINGS: Upper normal heart size.  Mediastinal contours and pulmonary vascularity normal.  Lungs clear.  No pleural effusion or pneumothorax.  Osseous structures unremarkable.  IMPRESSION: No acute abnormalities.   Electronically Signed   By: Ulyses Southward M.D.   On: 03/08/2015 17:43   Ct Head Wo Contrast  03/08/2015   CLINICAL DATA:  Body aches, dizziness and fatigue  EXAM: CT HEAD WITHOUT CONTRAST  TECHNIQUE: Contiguous axial images were obtained from the base of the skull through the vertex without intravenous contrast.  COMPARISON:  None.  FINDINGS: No acute cortical infarct, hemorrhage, or mass lesion ispresent. Ventricles are of normal size. No significant extra-axial fluid collection is present. The paranasal sinuses andmastoid air cells are clear. The osseous skull is intact.   IMPRESSION: 1. Normal brain.   Electronically Signed   By: Signa Kell M.D.   On: 03/08/2015 17:13    Microbiology: No results found for this or any previous visit (from the past 240 hour(s)).   Labs: Basic Metabolic Panel:  Recent Labs Lab 03/08/15 1634 03/09/15 0528  NA 131* 132*  K 4.6 3.4*  CL 96 104  CO2 21 21  GLUCOSE 479* 235*  BUN 14 12  CREATININE 1.28 0.95  CALCIUM 9.0 7.8*  MG  --  2.0  PHOS  --  2.6   Liver Function Tests:  Recent Labs Lab 03/08/15 1634 03/09/15 0528  AST 23 14  ALT 17 17  ALKPHOS 86 65  BILITOT 1.4* 0.5  PROT 7.9 6.2  ALBUMIN 4.5 3.4*   No results for input(s): LIPASE, AMYLASE in the last 168 hours.  No results for input(s): AMMONIA in the last 168 hours. CBC:  Recent Labs Lab 03/08/15 1634 03/09/15 0528  WBC 8.7 6.9  NEUTROABS 4.0  --   HGB 15.4 13.1  HCT 45.1 38.7*  MCV 82.8 82.9  PLT 217 174   Cardiac Enzymes:  Recent Labs Lab 03/09/15 03/09/15 0528  TROPONINI <0.03 <0.03   BNP: BNP (last 3 results) No results for input(s): BNP in the last 8760 hours.  ProBNP (last 3 results) No results for input(s): PROBNP in the last 8760 hours.  CBG:  Recent Labs Lab 03/08/15 2140 03/08/15 2255 03/09/15 0101 03/09/15 0303 03/09/15 0724  GLUCAP 337* 310* 262* 206* 247*    Time coordinating discharge: Over 30 minutes

## 2015-03-09 NOTE — Discharge Instructions (Signed)

## 2015-03-09 NOTE — Progress Notes (Addendum)
Inpatient Diabetes Program Recommendations  AACE/ADA: New Consensus Statement on Inpatient Glycemic Control (2013)  Target Ranges:  Prepandial:   less than 140 mg/dL      Peak postprandial:   less than 180 mg/dL (1-2 hours)      Critically ill patients:  140 - 180 mg/dL     Results for Dillon BjorkAYLOR, Ryu L (MRN [id number]) as of 03/09/2015 09:37  Ref. Range 03/08/2015 16:19 03/08/2015 17:56 03/08/2015 21:40 03/08/2015 22:55 03/09/2015 01:01 03/09/2015 03:03 03/09/2015 07:24  Glucose-Capillary Latest Ref Range: 70-99 mg/dL 829469 (H) 562502 (H) 130337 (H) 310 (H) 262 (H) 206 (H) 247 (H)     Admit with: Hyperglycemia/ New diagnosis of DM   Current DM Orders: Lantus 5 units QHS            Novolog Sensitive SSI Q4 hours     **Note patient given IVF in the ED.  Patient also given 5 units Lantus at midnight and started on Novolog Sensitive SSI as well.  **A1c pending.  **Have ordered diabetes educational materials for this patient.  Also ordered RD consult for DM diet education and Care management consult since patient does not have PCP nor health insurance.  **Will see patient today.  Addendum 1530PM: Attempted to visit with patient today to discuss new diagnosis of DM.  Patient had already d/c'd home before I got to speak with him.   Ambrose FinlandJeannine Johnston Maricella Filyaw RN, MSN, CDE Diabetes Coordinator Inpatient Diabetes Program Team Pager: [phone] (8a-5p)

## 2015-03-09 NOTE — Plan of Care (Signed)
Problem: Food- and Nutrition-Related Knowledge Deficit (NB-1.1) Goal: Nutrition education Formal process to instruct or train a patient/client in a skill or to impart knowledge to help patients/clients voluntarily manage or modify food choices and eating behavior to maintain or improve health. Outcome: Completed/Met Date Met:  03/09/15  RD consulted for nutrition education regarding diabetes.   No results found for: HGBA1C -pending  RD provided "Carbohydrate Counting for People with Diabetes" handout from the Academy of Nutrition and Dietetics. Discussed different food groups and their effects on blood sugar, emphasizing carbohydrate-containing foods. Provided list of carbohydrates and recommended serving sizes of common foods.  Discussed importance of controlled and consistent carbohydrate intake throughout the day. Provided examples of ways to balance meals/snacks and encouraged intake of high-fiber, whole grain complex carbohydrates. Teach back method used.  Expect good compliance with family support. Pt seems motivated to change.  Body mass index is 29.81 kg/(m^2). Pt meets criteria for overweight based on current BMI.  Current diet order is CHO modified, patient is consuming approximately 100% of meals at this time. Labs and medications reviewed. No further nutrition interventions warranted at this time. RD contact information provided. If additional nutrition issues arise, please re-consult RD.  Clayton Bibles, MS, RD, LDN Pager: [phone] After Hours Pager: [phone]

## 2015-03-10 LAB — HEMOGLOBIN A1C
HEMOGLOBIN A1C: 12.7 % — AB (ref 4.8–5.6)
Mean Plasma Glucose: 318 mg/dL

## 2015-03-14 ENCOUNTER — Ambulatory Visit: Payer: Self-pay | Attending: Family Medicine | Admitting: Family Medicine

## 2015-03-14 ENCOUNTER — Encounter: Payer: Self-pay | Admitting: Family Medicine

## 2015-03-14 VITALS — BP 110/73 | HR 89 | Temp 98.6°F | Resp 18 | Ht 73.0 in | Wt 227.6 lb

## 2015-03-14 DIAGNOSIS — Z720 Tobacco use: Secondary | ICD-10-CM

## 2015-03-14 DIAGNOSIS — E1169 Type 2 diabetes mellitus with other specified complication: Secondary | ICD-10-CM | POA: Insufficient documentation

## 2015-03-14 DIAGNOSIS — E11649 Type 2 diabetes mellitus with hypoglycemia without coma: Secondary | ICD-10-CM | POA: Insufficient documentation

## 2015-03-14 DIAGNOSIS — R824 Acetonuria: Secondary | ICD-10-CM | POA: Insufficient documentation

## 2015-03-14 DIAGNOSIS — Z23 Encounter for immunization: Secondary | ICD-10-CM | POA: Insufficient documentation

## 2015-03-14 DIAGNOSIS — E119 Type 2 diabetes mellitus without complications: Secondary | ICD-10-CM

## 2015-03-14 DIAGNOSIS — F17200 Nicotine dependence, unspecified, uncomplicated: Secondary | ICD-10-CM | POA: Insufficient documentation

## 2015-03-14 LAB — POCT URINALYSIS DIPSTICK
Bilirubin, UA: NEGATIVE
GLUCOSE UA: 500
Leukocytes, UA: NEGATIVE
Nitrite, UA: NEGATIVE
Protein, UA: NEGATIVE
RBC UA: NEGATIVE
Spec Grav, UA: 1.015
UROBILINOGEN UA: 0.2
pH, UA: 5.5

## 2015-03-14 LAB — GLUCOSE, POCT (MANUAL RESULT ENTRY)
POC Glucose: 430 mg/dl — AB (ref 70–99)
POC Glucose: 316 mg/dl — AB (ref 70–99)

## 2015-03-14 MED ORDER — BLOOD GLUCOSE MONITOR KIT: PACK | Status: DC

## 2015-03-14 MED ORDER — GLIPIZIDE 5 MG PO TABS: 5.0000 mg | ORAL_TABLET | Freq: Two times a day (BID) | ORAL | Status: DC

## 2015-03-14 MED ORDER — INSULIN GLARGINE 100 UNIT/ML ~~LOC~~ SOLN: 20.0000 [IU] | Freq: Once | SUBCUTANEOUS | Status: DC

## 2015-03-14 MED ORDER — INSULIN ASPART 100 UNIT/ML ~~LOC~~ SOLN: 20.0000 [IU] | Freq: Once | SUBCUTANEOUS | Status: DC

## 2015-03-14 MED ORDER — INV-SODIUM CHLORIDE 0.9% IV SOLN SWOG S1313
1.0000 | Freq: Once | INTRAVENOUS | Status: AC
  Administered 2015-03-14: 1 via INTRAVENOUS

## 2015-03-14 MED ORDER — PNEUMOCOCCAL VAC POLYVALENT 25 MCG/0.5ML IJ INJ
0.5000 mL | INJECTION | Freq: Once | INTRAMUSCULAR | Status: AC
  Administered 2015-03-14: 0.5 mL via INTRAMUSCULAR

## 2015-03-14 MED ORDER — METFORMIN HCL 500 MG PO TABS: ORAL_TABLET | ORAL | Status: DC

## 2015-03-14 MED ORDER — INSULIN ASPART 100 UNIT/ML ~~LOC~~ SOLN
20.0000 [IU] | Freq: Once | SUBCUTANEOUS | Status: AC
  Administered 2015-03-14: 20 [IU] via SUBCUTANEOUS

## 2015-03-14 MED ORDER — LISINOPRIL 2.5 MG PO TABS: 5.0000 mg | ORAL_TABLET | Freq: Every day | ORAL | Status: DC

## 2015-03-14 NOTE — Progress Notes (Signed)
Subjective:    Patient ID: Herbert Barnes, male    DOB: 1976/06/04, 39 y.o.   MRN: [id number]  HPI Herbert Barnes is here to establish care after hospitalization at [facility] between 03/08/15 and 03/09/15. He had presented to the ED with lightheadedness, polydipsia and polyuria and was found to have an elevated blood sugar 469 and a new diagnosis of diabetes mellitus was made for which he was placed on IV fluids and sliding scale insulin.Hba1c was 12.7 He was admitted for near syncope and kept on telemetry, his head CT was unremarkable. The patient refused insulin at discharge and so was placed on metformin.  Interval history: He reports feeling well overall but does forget to take his metformin. He forgot to take it last night but took it this morning and his blood sugar in the clinic is 430. His breakfast was 4 hours ago  Past Medical History  Diagnosis Date  . Headache     hx of migraine in past  . Environmental allergies   . Diabetes mellitus without complication     Past Surgical History  Procedure Laterality Date  . Hand surgery      History   Social History  . Marital Status: Legally Separated    Spouse Name: N/A  . Number of Children: N/A  . Years of Education: N/A   Occupational History  . Not on file.   Social History Main Topics  . Smoking status: Current Some Day Smoker -- 0.25 packs/day for 27 years  . Smokeless tobacco: Never Used  . Alcohol Use: Yes     Comment: ocassional  . Drug Use: No  . Sexual Activity: Yes    Birth Control/ Protection: None   Other Topics Concern  . Not on file   Social History Narrative    No Known Allergies  Current Outpatient Prescriptions on File Prior to Visit  Medication Sig Dispense Refill  . Multiple Vitamins-Minerals (MULTIVITAMIN & MINERAL PO) Take 1 tablet by mouth daily as needed (nutrition).     No current facility-administered medications on file prior to visit.    Review of Systems  Constitutional:  Negative for activity change and appetite change.  HENT: Negative for sinus pressure and sore throat.   Eyes: Negative for visual disturbance.  Respiratory: Negative for chest tightness and shortness of breath.   Cardiovascular: Negative for chest pain and palpitations.  Gastrointestinal: Negative for abdominal pain and abdominal distention.  Endocrine: Negative for cold intolerance, heat intolerance and polyphagia.  Genitourinary: Negative for dysuria, frequency and difficulty urinating.  Musculoskeletal: Negative for back pain, joint swelling and arthralgias.  Skin: Negative for color change.  Neurological: Negative for dizziness, tremors and weakness.  Psychiatric/Behavioral: Negative for suicidal ideas and behavioral problems.          Objective: Filed Vitals:   03/14/15 0950  BP: 110/73  Pulse: 89  Temp: 98.6 F (37 C)  Resp: 18      Physical Exam  Constitutional: He is oriented to person, place, and time. He appears well-developed and well-nourished.  HENT:  Head: Normocephalic and atraumatic.  Right Ear: External ear normal.  Left Ear: External ear normal.  Eyes: Conjunctivae and EOM are normal. Pupils are equal, round, and reactive to light.  Neck: Normal range of motion. Neck supple. No tracheal deviation present.  Cardiovascular: Normal rate, regular rhythm and normal heart sounds.   No murmur heard. Pulmonary/Chest: Effort normal and breath sounds normal. No respiratory distress. He has no wheezes.  He exhibits no tenderness.  Abdominal: Soft. Bowel sounds are normal. He exhibits no mass. There is no tenderness.  Musculoskeletal: Normal range of motion. He exhibits no edema or tenderness.  Neurological: He is alert and oriented to person, place, and time.  Skin: Skin is warm and dry.  Psychiatric: He has a normal mood and affect.      . Lab Results  Component Value Date   HGBA1C 12.7* 03/09/2015        Assessment & Plan:  39 year old male patient with  a new diagnosis of type 2 diabetes mellitus now with hypoglycemia and ketonuria and intact sensorium.  Diabetes mellitus with complications of Ketonuria. Uncontrolled with A1c of 12.7, CBG of 430 this morning, urine is positive for ketones. Placed on IV normal saline, 20 units of NovoLog given and will recheck blood sugar after a few hours. Metformin increased from [phone] mg twice daily and glipizide 5 mg twice daily added to regimen. He will need to be reassessed at his next office visit for need to increase the dose of glipizide. Educated on blood sugar goals: Fasting of 80-120, random of 200 mg/DL. Pneumovax given today, foot exam today and advised to comply with annual eye exams. Lisinopril added for renal protection.

## 2015-03-14 NOTE — Progress Notes (Signed)
Hospitalized with hyperglycemia, last week. Blood glucose currently 430. Patient didn't take metformin last night because he fell asleep.

## 2015-03-14 NOTE — Patient Instructions (Signed)
Diabetes Mellitus and Food It is important for you to manage your blood sugar (glucose) level. Your blood glucose level can be greatly affected by what you eat. Eating healthier foods in the appropriate amounts throughout the day at about the same time each day will help you control your blood glucose level. It can also help slow or prevent worsening of your diabetes mellitus. Healthy eating may even help you improve the level of your blood pressure and reach or maintain a healthy weight.  HOW CAN FOOD AFFECT ME? Carbohydrates Carbohydrates affect your blood glucose level more than any other type of food. Your dietitian will help you determine how many carbohydrates to eat at each meal and teach you how to count carbohydrates. Counting carbohydrates is important to keep your blood glucose at a healthy level, especially if you are using insulin or taking certain medicines for diabetes mellitus. Alcohol Alcohol can cause sudden decreases in blood glucose (hypoglycemia), especially if you use insulin or take certain medicines for diabetes mellitus. Hypoglycemia can be a life-threatening condition. Symptoms of hypoglycemia (sleepiness, dizziness, and disorientation) are similar to symptoms of having too much alcohol.  If your health care provider has given you approval to drink alcohol, do so in moderation and use the following guidelines:  Women should not have more than one drink per day, and men should not have more than two drinks per day. One drink is equal to:  12 oz of beer.  5 oz of wine.  1 oz of hard liquor.  Do not drink on an empty stomach.  Keep yourself hydrated. Have water, diet soda, or unsweetened iced tea.  Regular soda, juice, and other mixers might contain a lot of carbohydrates and should be counted. WHAT FOODS ARE NOT RECOMMENDED? As you make food choices, it is important to remember that all foods are not the same. Some foods have fewer nutrients per serving than other  foods, even though they might have the same number of calories or carbohydrates. It is difficult to get your body what it needs when you eat foods with fewer nutrients. Examples of foods that you should avoid that are high in calories and carbohydrates but low in nutrients include:  Trans fats (most processed foods list trans fats on the Nutrition Facts label).  Regular soda.  Juice.  Candy.  Sweets, such as cake, pie, doughnuts, and cookies.  Fried foods. WHAT FOODS CAN I EAT? Have nutrient-rich foods, which will nourish your body and keep you healthy. The food you should eat also will depend on several factors, including:  The calories you need.  The medicines you take.  Your weight.  Your blood glucose level.  Your blood pressure level.  Your cholesterol level. You also should eat a variety of foods, including:  Protein, such as meat, poultry, fish, tofu, nuts, and seeds (lean animal proteins are best).  Fruits.  Vegetables.  Dairy products, such as milk, cheese, and yogurt (low fat is best).  Breads, grains, pasta, cereal, rice, and beans.  Fats such as olive oil, trans fat-free margarine, canola oil, avocado, and olives. DOES EVERYONE WITH DIABETES MELLITUS HAVE THE SAME MEAL PLAN? Because every person with diabetes mellitus is different, there is not one meal plan that works for everyone. It is very important that you meet with a dietitian who will help you create a meal plan that is just right for you. Document Released: 08/08/2005 Document Revised: 11/16/2013 Document Reviewed: 10/08/2013 Exit[employer] Patient Information 2015 [employer]. This   information is not intended to replace advice given to you by your health care provider. Make sure you discuss any questions you have with your health care provider.  

## 2015-03-15 NOTE — Progress Notes (Signed)
Quick Note:  Labs addressed at office as well as medications and patient was made aware and he received IVF ______

## 2015-04-06 ENCOUNTER — Ambulatory Visit: Payer: Self-pay

## 2015-04-17 ENCOUNTER — Ambulatory Visit: Payer: Self-pay

## 2016-08-31 ENCOUNTER — Encounter (HOSPITAL_COMMUNITY): Payer: Self-pay | Admitting: *Deleted

## 2016-08-31 ENCOUNTER — Emergency Department (HOSPITAL_COMMUNITY)
Admission: EM | Admit: 2016-08-31 | Discharge: 2016-08-31 | Disposition: A | Discharge status: Home / Self Care | Payer: Self-pay | Attending: Emergency Medicine | Admitting: Emergency Medicine

## 2016-08-31 ENCOUNTER — Emergency Department (HOSPITAL_COMMUNITY): Payer: Self-pay

## 2016-08-31 DIAGNOSIS — Z7982 Long term (current) use of aspirin: Secondary | ICD-10-CM | POA: Insufficient documentation

## 2016-08-31 DIAGNOSIS — F17200 Nicotine dependence, unspecified, uncomplicated: Secondary | ICD-10-CM | POA: Insufficient documentation

## 2016-08-31 DIAGNOSIS — E119 Type 2 diabetes mellitus without complications: Secondary | ICD-10-CM | POA: Insufficient documentation

## 2016-08-31 DIAGNOSIS — B349 Viral infection, unspecified: Secondary | ICD-10-CM | POA: Insufficient documentation

## 2016-08-31 DIAGNOSIS — Z79899 Other long term (current) drug therapy: Secondary | ICD-10-CM | POA: Insufficient documentation

## 2016-08-31 DIAGNOSIS — Z7984 Long term (current) use of oral hypoglycemic drugs: Secondary | ICD-10-CM | POA: Insufficient documentation

## 2016-08-31 MED ORDER — NAPROXEN 500 MG PO TABS: 500.0000 mg | ORAL_TABLET | Freq: Two times a day (BID) | ORAL | 0 refills | Status: DC

## 2016-08-31 MED ORDER — ONDANSETRON 4 MG PO TBDP: 4.0000 mg | ORAL_TABLET | Freq: Three times a day (TID) | ORAL | 0 refills | Status: DC | PRN

## 2016-08-31 MED ORDER — ONDANSETRON HCL 4 MG/2ML IJ SOLN
4.0000 mg | Freq: Once | INTRAMUSCULAR | Status: AC
  Administered 2016-08-31: 4 mg via INTRAVENOUS
  Filled 2016-08-31: qty 2

## 2016-08-31 MED ORDER — KETOROLAC TROMETHAMINE 30 MG/ML IJ SOLN
30.0000 mg | Freq: Once | INTRAMUSCULAR | Status: AC
  Administered 2016-08-31: 30 mg via INTRAVENOUS
  Filled 2016-08-31: qty 1

## 2016-08-31 MED ORDER — BENZONATATE 100 MG PO CAPS: 100.0000 mg | ORAL_CAPSULE | Freq: Three times a day (TID) | ORAL | 0 refills | Status: DC

## 2016-08-31 MED ORDER — SODIUM CHLORIDE 0.9 % IV BOLUS (SEPSIS)
1000.0000 mL | Freq: Once | INTRAVENOUS | Status: AC
  Administered 2016-08-31: 1000 mL via INTRAVENOUS

## 2016-08-31 MED ORDER — BENZONATATE 100 MG PO CAPS
100.0000 mg | ORAL_CAPSULE | Freq: Once | ORAL | Status: AC
  Administered 2016-08-31: 100 mg via ORAL
  Filled 2016-08-31: qty 1

## 2016-08-31 NOTE — ED Notes (Signed)
Pt to xray

## 2016-08-31 NOTE — ED Triage Notes (Signed)
Pt reports feeling bad for nearly a week but last night onset of headaches, bodyaches and fever/chills. Denies sore throat. Mask on pt at triage. Denies n/v/d.

## 2016-08-31 NOTE — ED Notes (Signed)
Pt tolerating PO fluids

## 2016-08-31 NOTE — Discharge Instructions (Signed)
There were no signs of pneumonia or other abnormalities on your chest xray. Your symptoms are consistent with a viral illness. Viruses do not require antibiotics. Treatment is symptomatic care. Drink plenty of fluids and get plenty of rest. You should be drinking at least a one half to one liter of water an hour to stay hydrated. Ibuprofen, Naproxen, or Tylenol for pain or fever. Zofran for nausea. Tessalon for cough. Plain Mucinex may help relieve congestion. Warm liquids or Chloraseptic spray may help soothe a sore throat. Symptoms may last 7-10 days. Follow up with a primary care provider as needed should symptoms continue.

## 2016-08-31 NOTE — ED Provider Notes (Signed)
[facility] DEPT Provider Note   CSN: [id number] Arrival date & time: 08/31/16  1020     History   Chief Complaint Chief Complaint  Patient presents with  . Headache  . Generalized Body Aches    HPI Herbert Barnes is a 40 y.o. male.  HPI    Herbert Barnes is a 40 y.o. male, with a history of DM and Migraine, presenting to the ED with fatigue for the past week. Developed productive cough with green sputum, headache, body aches, and chills beginning last night. Headache is generalized, throbbing, moderate in intensity, nonradiating. Pt has been taking DayQuil with last dose last night. Denies sore throat, rash, neck pain/stiffness, vomiting/diarrhea, abdominal pain, shortness of breath, or any other complaints.      Past Medical History:  Diagnosis Date  . Diabetes mellitus without complication ([place])   . Environmental allergies   . Headache    hx of migraine in past    Patient Active Problem List   Diagnosis Date Noted  . New onset type 2 diabetes mellitus ([place])   . Hypokalemia   . Near syncope   . Syncope 03/08/2015  . DM hyperosmolarity type II ([place]) 03/08/2015  . Hyperglycemia 03/08/2015    Past Surgical History:  Procedure Laterality Date  . HAND SURGERY         Home Medications    Prior to Admission medications   Medication Sig Start Date End Date Taking? Authorizing Provider  aspirin-acetaminophen-caffeine (EXCEDRIN MIGRAINE) [phone] MG tablet Take 2 tablets by mouth every 6 (six) hours as needed for headache.   Yes Historical Provider, MD  benzonatate (TESSALON) 100 MG capsule Take 1 capsule (100 mg total) by mouth every 8 (eight) hours. 08/31/16   Caoimhe Damron C Knute Mazzuca, PA-C  blood glucose meter kit and supplies KIT Dispense based on patient and insurance preference. Use up to four times daily as directed. (FOR ICD-9 250.00, 250.01). Patient not taking: Reported on 08/31/2016 03/14/15   Arnoldo Morale, MD  glipiZIDE (GLUCOTROL) 5 MG tablet Take 1 tablet  (5 mg total) by mouth 2 (two) times daily before a meal. Patient not taking: Reported on 08/31/2016 03/14/15   Arnoldo Morale, MD  lisinopril (PRINIVIL,ZESTRIL) 2.5 MG tablet Take 2 tablets (5 mg total) by mouth daily. Patient not taking: Reported on 08/31/2016 03/14/15   Arnoldo Morale, MD  metFORMIN (GLUCOPHAGE) 500 MG tablet 2 tablets ('1000mg'$ ) by mouth twice daily in the morning and evening Patient not taking: Reported on 08/31/2016 03/14/15   Arnoldo Morale, MD  naproxen (NAPROSYN) 500 MG tablet Take 1 tablet (500 mg total) by mouth 2 (two) times daily. 08/31/16   Dortha Neighbors C Rikayla Demmon, PA-C  ondansetron (ZOFRAN ODT) 4 MG disintegrating tablet Take 1 tablet (4 mg total) by mouth every 8 (eight) hours as needed for nausea or vomiting. 08/31/16   Lorayne Bender, PA-C    Family History Family History  Problem Relation Age of Onset  . CAD Mother   . Diabetes type II Father     Social History Social History  Substance Use Topics  . Smoking status: Current Some Day Smoker    Packs/day: 0.25    Years: 27.00  . Smokeless tobacco: Never Used  . Alcohol use Yes     Comment: ocassional     Allergies   Review of patient's allergies indicates no known allergies.   Review of Systems Review of Systems  Constitutional: Positive for chills and fatigue.  Respiratory: Positive for cough. Negative for shortness  of breath.   Cardiovascular: Negative for chest pain.  Gastrointestinal: Positive for nausea. Negative for abdominal pain, constipation, diarrhea and vomiting.  Musculoskeletal: Positive for myalgias. Negative for neck pain and neck stiffness.  All other systems reviewed and are negative.    Physical Exam Updated Vital Signs BP 141/80   Pulse 83   Temp 99.6 F (37.6 C) (Oral)   Resp 18   SpO2 99%   Physical Exam  Constitutional: He is oriented to person, place, and time. He appears well-developed and well-nourished. No distress.  HENT:  Head: Normocephalic and atraumatic.  Mouth/Throat:  Oropharynx is clear and moist.  Eyes: Conjunctivae and EOM are normal. Pupils are equal, round, and reactive to light.  Neck: Normal range of motion. Neck supple.  Cardiovascular: Normal rate, regular rhythm, normal heart sounds and intact distal pulses.   Pulmonary/Chest: Effort normal and breath sounds normal. No respiratory distress.  Abdominal: Soft. There is no tenderness. There is no guarding.  Musculoskeletal: He exhibits tenderness (Generalized muscle tenderness). He exhibits no edema.  Full ROM in all extremities and spine. No midline spinal tenderness.   Lymphadenopathy:    He has no cervical adenopathy.  Neurological: He is alert and oriented to person, place, and time.  No sensory deficits. Strength 5/5 in all extremities. No gait disturbance. Coordination intact. Cranial nerves III-XII grossly intact. No facial droop.   Skin: Skin is warm and dry. He is not diaphoretic.  Psychiatric: He has a normal mood and affect. His behavior is normal.  Nursing note and vitals reviewed.    ED Treatments / Results  Labs (all labs ordered are listed, but only abnormal results are displayed) Labs Reviewed - No data to display  EKG  EKG Interpretation None       Radiology Dg Chest 2 View  Result Date: 08/31/2016 CLINICAL DATA:  Shortness of breath for 1 week.  Cough. EXAM: CHEST  2 VIEW COMPARISON:  March 08, 2015 FINDINGS: Lungs are clear. Heart size and pulmonary vascularity are normal. No adenopathy. No bone lesions. IMPRESSION: No edema or consolidation. Electronically Signed   By: Lowella Grip III M.D.   On: 08/31/2016 13:44    Procedures Procedures (including critical care time)  Medications Ordered in ED Medications  benzonatate (TESSALON) capsule 100 mg (100 mg Oral Given 08/31/16 1242)  sodium chloride 0.9 % bolus 1,000 mL (0 mLs Intravenous Stopped 08/31/16 1423)  ondansetron (ZOFRAN) injection 4 mg (4 mg Intravenous Given 08/31/16 1242)  ketorolac (TORADOL) 30  MG/ML injection 30 mg (30 mg Intravenous Given 08/31/16 1242)     Initial Impression / Assessment and Plan / ED Course  I have reviewed the triage vital signs and the nursing notes.  Pertinent labs & imaging results that were available during my care of the patient were reviewed by me and considered in my medical decision making (see chart for details).  Clinical Course    Patient presents with fatigue, headache, body ache, and chills. Suspect viral illness. Patient is nontoxic appearing, afebrile, not tachycardic, not tachypneic, not hypotensive, maintains SPO2 of 98% on room air, and is in no apparent distress. Patient has no signs of sepsis or other serious or life-threatening condition. Exam is not concerning for meningitis. No signs of pneumonia on chest x-ray. The patient was given instructions for home care as well as return precautions. Patient voices understanding of these instructions, accepts the plan, and is comfortable with discharge.   Vitals:   08/31/16 1029 08/31/16 1200 08/31/16 1215  08/31/16 1245  BP: 141/85 141/80 143/81 151/90  Pulse: 98 83 81 87  Resp: '20 18 16 18  '$ Temp: 99.6 F (37.6 C)     TempSrc: Oral     SpO2: 96% 99% 99% 98%   Vitals:   08/31/16 1200 08/31/16 1215 08/31/16 1245 08/31/16 1424  BP: 141/80 143/81 151/90 116/59  Pulse: 83 81 87 82  Resp: '18 16 18 18  '$ Temp:    99.5 F (37.5 C)  TempSrc:    Oral  SpO2: 99% 99% 98% 97%     Final Clinical Impressions(s) / ED Diagnoses   Final diagnoses:  Viral syndrome    New Prescriptions Discharge Medication List as of 08/31/2016  2:18 PM    START taking these medications   Details  benzonatate (TESSALON) 100 MG capsule Take 1 capsule (100 mg total) by mouth every 8 (eight) hours., Starting Sat 08/31/2016, Print    naproxen (NAPROSYN) 500 MG tablet Take 1 tablet (500 mg total) by mouth 2 (two) times daily., Starting Sat 08/31/2016, Print    ondansetron (ZOFRAN ODT) 4 MG disintegrating tablet Take 1  tablet (4 mg total) by mouth every 8 (eight) hours as needed for nausea or vomiting., Starting Sat 08/31/2016, Print         Lorayne Bender, PA-C 08/31/16 1809    Gwenyth Allegra Tegeler, MD 08/31/16 2021

## 2017-05-04 ENCOUNTER — Encounter (HOSPITAL_BASED_OUTPATIENT_CLINIC_OR_DEPARTMENT_OTHER): Payer: Self-pay | Admitting: Emergency Medicine

## 2017-05-04 ENCOUNTER — Emergency Department (HOSPITAL_BASED_OUTPATIENT_CLINIC_OR_DEPARTMENT_OTHER)
Admission: EM | Admit: 2017-05-04 | Discharge: 2017-05-04 | Disposition: A | Discharge status: Home / Self Care | Payer: Self-pay | Attending: Emergency Medicine | Admitting: Emergency Medicine

## 2017-05-04 DIAGNOSIS — E119 Type 2 diabetes mellitus without complications: Secondary | ICD-10-CM | POA: Insufficient documentation

## 2017-05-04 DIAGNOSIS — F17200 Nicotine dependence, unspecified, uncomplicated: Secondary | ICD-10-CM | POA: Insufficient documentation

## 2017-05-04 DIAGNOSIS — T63444A Toxic effect of venom of bees, undetermined, initial encounter: Secondary | ICD-10-CM

## 2017-05-04 DIAGNOSIS — T63441A Toxic effect of venom of bees, accidental (unintentional), initial encounter: Secondary | ICD-10-CM | POA: Insufficient documentation

## 2017-05-04 MED ORDER — KETOROLAC TROMETHAMINE 60 MG/2ML IM SOLN
30.0000 mg | Freq: Once | INTRAMUSCULAR | Status: AC
  Administered 2017-05-04: 30 mg via INTRAMUSCULAR
  Filled 2017-05-04: qty 2

## 2017-05-04 MED ORDER — HYDROCORTISONE 1 % EX CREA: TOPICAL_CREAM | CUTANEOUS | 0 refills | Status: DC

## 2017-05-04 MED ORDER — DIPHENHYDRAMINE HCL 25 MG PO CAPS
50.0000 mg | ORAL_CAPSULE | Freq: Once | ORAL | Status: AC
  Administered 2017-05-04: 50 mg via ORAL
  Filled 2017-05-04: qty 2

## 2017-05-04 NOTE — Discharge Instructions (Signed)
Ice area several times a day. Tylenol or motrin for pain. Hydrocortisone cream topically. Benadryl 25mg  every 4 hrs. Follow up with family doctor as needed. Return if any swelling of lips, tongue, difficulty breathing, any other concerning symptom.

## 2017-05-04 NOTE — ED Triage Notes (Signed)
Pt states he was stung to the back of the head by a wasp. C/o pain, denies bee allergy.

## 2017-05-04 NOTE — ED Notes (Signed)
Pt discharged to home with family. NAD.  

## 2017-05-04 NOTE — ED Provider Notes (Signed)
[facility]   CSN: [id number] Arrival date & time: 05/04/17  1717   By signing my name below, I, Soijett Blue, attest that this documentation has been prepared under the direction and in the presence of Jaynie Crumble, [employer] Electronically Signed: Soijett Blue, ED Scribe. 05/04/17. 5:58 PM.  History   Chief Complaint Chief Complaint  Patient presents with  . Insect Bite    HPI Herbert Barnes is a 41 y.o. male with a PMHx of DM, who presents to the Emergency Department complaining of insect bite onset PTA. He reports associated HA. Pt has tried placing onion to the affected area without medications with minimal relief of his symptoms. He notes that he was stung to his posterior head by a wasp and has had pain to the area since. He denies color change, fever, chills, and any other symptoms.     The history is provided by the patient and a significant other. No language interpreter was used.    Past Medical History:  Diagnosis Date  . Diabetes mellitus without complication ([facility])   . Environmental allergies   . Headache    hx of migraine in past    Patient Active Problem List   Diagnosis Date Noted  . New onset type 2 diabetes mellitus ([facility])   . Hypokalemia   . Near syncope   . Syncope 03/08/2015  . DM hyperosmolarity type II ([facility]) 03/08/2015  . Hyperglycemia 03/08/2015    Past Surgical History:  Procedure Laterality Date  . HAND SURGERY         Home Medications    Prior to Admission medications   Medication Sig Start Date End Date Taking? Authorizing Provider  aspirin-acetaminophen-caffeine (EXCEDRIN MIGRAINE) [phone] MG tablet Take 2 tablets by mouth every 6 (six) hours as needed for headache.    [provider]    Family History Family History  Problem Relation Age of Onset  . CAD Mother   . Diabetes type II Father     Social History Social History  Substance Use Topics  . Smoking status: Current Some Day  Smoker    Packs/day: 0.25    Years: 27.00  . Smokeless tobacco: Never Used  . Alcohol use Yes     Comment: ocassional     Allergies   Patient has no known allergies.   Review of Systems Review of Systems  Constitutional: Negative for chills and fever.  Skin: Negative for color change.       +insect bite to posterior head  Neurological: Positive for headaches.     Physical Exam Updated Vital Signs BP 127/66 (BP Location: Left Arm)   Pulse 76   Temp 99 F (37.2 C) (Oral)   Resp 20   Ht 6\' 1"  (1.854 m)   Wt 220 lb (99.8 kg)   SpO2 99%   BMI 29.03 kg/m   Physical Exam  Constitutional: He is oriented to person, place, and time. He appears well-developed and well-nourished. No distress.  HENT:  Head: Normocephalic and atraumatic.  Nose: Nose normal.  Mouth/Throat: Oropharynx is clear and moist.  No swelling of the lips, tongue, oropharynx  Eyes: EOM are normal.  Neck: Neck supple.  Cardiovascular: Normal rate.   Pulmonary/Chest: Effort normal and breath sounds normal. No respiratory distress. He has no wheezes. He has no rales.  Abdominal: He exhibits no distension.  Musculoskeletal: Normal range of motion.  Neurological: He is alert and oriented to person, place, and time.  Skin: Skin  is warm and dry.  Erythema and central papule noted to the assisted pedal area of the head, with tenderness to palpation. No stinger noted.  Psychiatric: He has a normal mood and affect. His behavior is normal.  Nursing note and vitals reviewed.    ED Treatments / Results  DIAGNOSTIC STUDIES: Oxygen Saturation is 99% on RA, nl by my interpretation.    COORDINATION OF CARE: 5:56 PM Discussed treatment plan with pt at bedside and pt agreed to plan.   Labs (all labs ordered are listed, but only abnormal results are displayed) Labs Reviewed - No data to display  EKG  EKG Interpretation None       Radiology No results found.  Procedures Procedures (including critical  care time)  Medications Ordered in ED Medications - No data to display   Initial Impression / Assessment and Plan / ED Course  I have reviewed the triage vital signs and the nursing notes.  Pertinent labs & imaging results that were available during my care of the patient were reviewed by me and considered in my medical decision making (see chart for details).     Patient emergency department with a insect sting to the back of the head. Mild localized reaction noted. No stinger. Patient states triggering a headache. Toradol given for the headache and emergency department. Benadryl for reaction. He does not have allergies to that BR wasp stings and does not have any symptoms of an allergic reaction at this time. Continue Benadryl at home, ice pack, hydrocortisone cream topically, follow-up with family doctor as needed.  Vitals:   05/04/17 1724 05/04/17 1725  BP:  127/66  Pulse:  76  Resp:  20  Temp:  99 F (37.2 C)  TempSrc:  Oral  SpO2:  99%  Weight: 99.8 kg (220 lb)   Height: 6\' 1"  (1.854 m)        Final Clinical Impressions(s) / ED Diagnoses   Final diagnoses:  Bee sting, undetermined intent, initial encounter    New Prescriptions Discharge Medication List as of 05/04/2017  6:01 PM    START taking these medications   Details  hydrocortisone cream 1 % Apply to affected area 2 times daily, Print       I personally performed the services described in this documentation, which was scribed in my presence. The recorded information has been reviewed and is accurate.     Jaynie CrumbleKirichenko, Naela Nodal, PA-C 05/04/17 2328    Maia PlanLong, Joshua G, MD 05/05/17 1053

## 2018-09-01 ENCOUNTER — Emergency Department (HOSPITAL_COMMUNITY)
Admission: EM | Admit: 2018-09-01 | Discharge: 2018-09-01 | Disposition: A | Discharge status: Home / Self Care | Payer: Self-pay | Attending: Emergency Medicine | Admitting: Emergency Medicine

## 2018-09-01 ENCOUNTER — Encounter (HOSPITAL_COMMUNITY): Payer: Self-pay

## 2018-09-01 ENCOUNTER — Other Ambulatory Visit: Payer: Self-pay

## 2018-09-01 DIAGNOSIS — F17210 Nicotine dependence, cigarettes, uncomplicated: Secondary | ICD-10-CM | POA: Insufficient documentation

## 2018-09-01 DIAGNOSIS — Z7982 Long term (current) use of aspirin: Secondary | ICD-10-CM | POA: Insufficient documentation

## 2018-09-01 DIAGNOSIS — E119 Type 2 diabetes mellitus without complications: Secondary | ICD-10-CM | POA: Insufficient documentation

## 2018-09-01 DIAGNOSIS — R251 Tremor, unspecified: Secondary | ICD-10-CM | POA: Insufficient documentation

## 2018-09-01 LAB — CBC WITH DIFFERENTIAL/PLATELET
ABS IMMATURE GRANULOCYTES: 0.02 10*3/uL (ref 0.00–0.07)
Basophils Absolute: 0 10*3/uL (ref 0.0–0.1)
Basophils Relative: 0 %
EOS PCT: 2 %
Eosinophils Absolute: 0.1 10*3/uL (ref 0.0–0.5)
HCT: 42.6 % (ref 39.0–52.0)
Hemoglobin: 13.6 g/dL (ref 13.0–17.0)
Immature Granulocytes: 0 %
Lymphocytes Relative: 41 %
Lymphs Abs: 2.9 10*3/uL (ref 0.7–4.0)
MCH: 27.6 pg (ref 26.0–34.0)
MCHC: 31.9 g/dL (ref 30.0–36.0)
MCV: 86.6 fL (ref 80.0–100.0)
MONO ABS: 0.6 10*3/uL (ref 0.1–1.0)
MONOS PCT: 8 %
NEUTROS ABS: 3.6 10*3/uL (ref 1.7–7.7)
Neutrophils Relative %: 49 %
Platelets: 253 10*3/uL (ref 150–400)
RBC: 4.92 MIL/uL (ref 4.22–5.81)
RDW: 14.1 % (ref 11.5–15.5)
WBC: 7.3 10*3/uL (ref 4.0–10.5)
nRBC: 0 % (ref 0.0–0.2)

## 2018-09-01 LAB — I-STAT CHEM 8, ED
BUN: 12 mg/dL (ref 6–20)
CALCIUM ION: 1.17 mmol/L (ref 1.15–1.40)
CHLORIDE: 105 mmol/L (ref 98–111)
Creatinine, Ser: 0.9 mg/dL (ref 0.61–1.24)
GLUCOSE: 131 mg/dL — AB (ref 70–99)
HCT: 41 % (ref 39.0–52.0)
Hemoglobin: 13.9 g/dL (ref 13.0–17.0)
Potassium: 4 mmol/L (ref 3.5–5.1)
SODIUM: 139 mmol/L (ref 135–145)
TCO2: 25 mmol/L (ref 22–32)

## 2018-09-01 NOTE — ED Triage Notes (Signed)
Patient c/o whole body shakiness and feeling jittery only when he lays down approx 30 minutes ago. Patient also c/o "metal taste" in his mouth x 30 minutes

## 2018-09-01 NOTE — Discharge Instructions (Addendum)
We signed the ER for tremors. The lab results are normal including a blood sugar.  We are not sure what is causing your symptoms at this time.  Please get some rest and see if the symptoms involved.  If your symptoms are progressing please return to the ER.

## 2018-09-01 NOTE — ED Provider Notes (Signed)
[facility]-EMERGENCY DEPT Provider Note   CSN: [id number] Arrival date & time: 09/01/18  1053     History   Chief Complaint Chief Complaint  Patient presents with  . shakiness  . metal taste    HPI Herbert Barnes is a 42 y.o. male.  HPI  42 year old male with history of borderline diabetes comes in with chief complaint of shaking. Patient reports that he did not sleep well all night, this morning when he tried to go to sleep he started having episodes of shaking.  Patient describes the shaking as generalized body tremors.  His symptoms resolved as soon as he got up.  Patient went on to lay down again and the symptoms return.  Patient denies any nausea, vomiting, fevers, UTI-like symptoms, abdominal pain, cough, URI-like symptoms.  He does endorse that he has been having metallic taste in his mouth today.  Patient has no history of similar symptoms in the past and denies any focal numbness, tingling, vision changes or dizziness.  Past Medical History:  Diagnosis Date  . Diabetes mellitus without complication ([facility])   . Environmental allergies   . Headache    hx of migraine in past    Patient Active Problem List   Diagnosis Date Noted  . New onset type 2 diabetes mellitus ([facility])   . Hypokalemia   . Near syncope   . Syncope 03/08/2015  . DM hyperosmolarity type II ([facility]) 03/08/2015  . Hyperglycemia 03/08/2015    Past Surgical History:  Procedure Laterality Date  . HAND SURGERY          Home Medications    Prior to Admission medications   Medication Sig Start Date End Date Taking? Authorizing Provider  aspirin-acetaminophen-caffeine (EXCEDRIN MIGRAINE) [phone] MG tablet Take 2 tablets by mouth every 6 (six) hours as needed for headache.    [provider]  hydrocortisone cream 1 % Apply to affected area 2 times daily 05/04/17   Jaynie Crumble, PA-C    Family History Family History  Problem Relation Age of Onset  . CAD  Mother   . Diabetes type II Father     Social History Social History   Tobacco Use  . Smoking status: Current Some Day Smoker    Packs/day: 0.25    Years: 27.00    Pack years: 6.75  . Smokeless tobacco: Never Used  Substance Use Topics  . Alcohol use: Yes    Comment: ocassional  . Drug use: No     Allergies   Patient has no known allergies.   Review of Systems Review of Systems  Constitutional: Positive for activity change.  Cardiovascular: Negative for chest pain.  Gastrointestinal: Negative for vomiting.  Neurological: Negative for numbness.     Physical Exam Updated Vital Signs BP (!) 154/90 (BP Location: Left Arm)   Pulse 71   Temp 98.1 F (36.7 C) (Oral)   Resp 16   Ht 6' 1.25" (1.861 m)   Wt 106.6 kg   SpO2 100%   BMI 30.79 kg/m   Physical Exam  Constitutional: He is oriented to person, place, and time. He appears well-developed.  HENT:  Head: Atraumatic.  Eyes: Pupils are equal, round, and reactive to light. EOM are normal.  Neck: Neck supple.  Cardiovascular: Normal rate and regular rhythm.  Pulmonary/Chest: Breath sounds normal.  Neurological: He is alert and oriented to person, place, and time.  Cerebellar exam is normal (finger to nose) Sensory exam normal for bilateral upper and lower  extremities - and patient is able to discriminate between sharp and dull. Motor exam is 4+/5   Skin: Skin is warm.  Nursing note and vitals reviewed.    ED Treatments / Results  Labs (all labs ordered are listed, but only abnormal results are displayed) Labs Reviewed  I-STAT CHEM 8, ED - Abnormal; Notable for the following components:      Result Value   Glucose, Bld 131 (*)    All other components within normal limits  CBC WITH DIFFERENTIAL/PLATELET    EKG None  Radiology No results found.  Procedures Procedures (including critical care time)  Medications Ordered in ED Medications - No data to display   Initial Impression / Assessment  and Plan / ED Course  I have reviewed the triage vital signs and the nursing notes.  Pertinent labs & imaging results that were available during my care of the patient were reviewed by me and considered in my medical decision making (see chart for details).     42 year old male comes in with chief complaint of metallic taste and shakiness. His symptoms have resolved, at least the shaking part.  History is not suggestive of any underlying infectious process going on.  Patient has borderline diabetes, but his blood sugar appears to be normal.  We will discharge the patient.  He shall return to the ER if his symptoms are evolving and getting worse. Final Clinical Impressions(s) / ED Diagnoses   Final diagnoses:  Occasional tremors    ED Discharge Orders    None       Derwood Kaplan, MD 09/01/18 1254

## 2018-09-04 ENCOUNTER — Other Ambulatory Visit: Payer: Self-pay

## 2018-09-04 ENCOUNTER — Encounter (HOSPITAL_COMMUNITY): Payer: Self-pay

## 2018-09-04 ENCOUNTER — Emergency Department (HOSPITAL_COMMUNITY): Payer: Self-pay

## 2018-09-04 ENCOUNTER — Emergency Department (HOSPITAL_COMMUNITY)
Admission: EM | Admit: 2018-09-04 | Discharge: 2018-09-04 | Disposition: A | Discharge status: Home / Self Care | Payer: Self-pay | Attending: Emergency Medicine | Admitting: Emergency Medicine

## 2018-09-04 DIAGNOSIS — F17200 Nicotine dependence, unspecified, uncomplicated: Secondary | ICD-10-CM | POA: Insufficient documentation

## 2018-09-04 DIAGNOSIS — R0981 Nasal congestion: Secondary | ICD-10-CM | POA: Insufficient documentation

## 2018-09-04 DIAGNOSIS — R05 Cough: Secondary | ICD-10-CM | POA: Insufficient documentation

## 2018-09-04 DIAGNOSIS — Z79899 Other long term (current) drug therapy: Secondary | ICD-10-CM | POA: Insufficient documentation

## 2018-09-04 DIAGNOSIS — E119 Type 2 diabetes mellitus without complications: Secondary | ICD-10-CM | POA: Insufficient documentation

## 2018-09-04 DIAGNOSIS — R079 Chest pain, unspecified: Secondary | ICD-10-CM | POA: Insufficient documentation

## 2018-09-04 DIAGNOSIS — R059 Cough, unspecified: Secondary | ICD-10-CM

## 2018-09-04 MED ORDER — FLUTICASONE PROPIONATE 50 MCG/ACT NA SUSP
2.0000 | Freq: Every day | NASAL | 0 refills | Status: DC
Start: 2018-09-04 — End: 2020-10-17

## 2018-09-04 MED ORDER — BENZONATATE 100 MG PO CAPS: 100.0000 mg | ORAL_CAPSULE | Freq: Three times a day (TID) | ORAL | 0 refills | Status: DC | PRN

## 2018-09-04 NOTE — ED Provider Notes (Signed)
[facility]-EMERGENCY DEPT Provider Note   CSN: [id number] Arrival date & time: 09/04/18  1534     History   Chief Complaint Chief Complaint  Patient presents with  . Cough  . Hemoptysis    HPI Herbert Barnes is a 42 y.o. male with history of diabetes mellitus, seasonal allergies, migraine headaches presents for evaluation of acute onset, progressively worsening cough for 2 weeks.  He was seen and evaluated in the ED for body shakes 2 days ago and was found to be stable for discharge home.  Since then he states that he has had 2 episodes of hemoptysis in which he has had "blood mixed with my mucous".  He states that the past 2 weeks he has had a cough which has been intermittently productive of sputum.  He notes nasal congestion and intermittent chest tightness when he is coughing but denies any chest pain or shortness of breath.  He denies sore throat, fevers, chills, abdominal pain, nausea, vomiting, leg swelling, recent travel or surgeries, prior history of DVT or PE, or hormonal placement therapy.  No history of cancer.  He has been trying over-the-counter cough and cold medicines with some relief.  His wife has similar symptoms which began at around the same time.   The history is provided by the patient.    Past Medical History:  Diagnosis Date  . Diabetes mellitus without complication (HCC)   . Environmental allergies   . Headache    hx of migraine in past    Patient Active Problem List   Diagnosis Date Noted  . New onset type 2 diabetes mellitus (HCC)   . Hypokalemia   . Near syncope   . Syncope 03/08/2015  . DM hyperosmolarity type II (HCC) 03/08/2015  . Hyperglycemia 03/08/2015    Past Surgical History:  Procedure Laterality Date  . HAND SURGERY          Home Medications    Prior to Admission medications   Medication Sig Start Date End Date Taking? Authorizing Provider  aspirin-acetaminophen-caffeine (EXCEDRIN MIGRAINE) [id number]  MG tablet Take 2 tablets by mouth every 6 (six) hours as needed for headache.    [provider]  benzonatate (TESSALON) 100 MG capsule Take 1 capsule (100 mg total) by mouth 3 (three) times daily as needed for cough. 09/04/18   Dawnelle Warman A, PA-C  fluticasone (FLONASE) 50 MCG/ACT nasal spray Place 2 sprays into both nostrils daily. 09/04/18   Luevenia Maxin, Barrett Holthaus A, PA-C  hydrocortisone cream 1 % Apply to affected area 2 times daily 05/04/17   Jaynie Crumble, PA-C    Family History Family History  Problem Relation Age of Onset  . CAD Mother   . Diabetes type II Father     Social History Social History   Tobacco Use  . Smoking status: Current Some Day Smoker    Packs/day: 0.25    Years: 27.00    Pack years: 6.75  . Smokeless tobacco: Never Used  Substance Use Topics  . Alcohol use: Yes    Comment: ocassional  . Drug use: No     Allergies   Patient has no known allergies.   Review of Systems Review of Systems  Constitutional: Negative for fever.  HENT: Positive for congestion. Negative for sore throat.   Respiratory: Positive for cough and chest tightness.   Cardiovascular: Negative for chest pain and leg swelling.  Gastrointestinal: Negative for abdominal pain, nausea and vomiting.  All other systems reviewed and are  negative.    Physical Exam Updated Vital Signs BP 127/83 (BP Location: Left Arm)   Pulse 73   Temp 98.3 F (36.8 C) (Oral)   Resp 16   Ht 6\' 1"  (1.854 m)   Wt 106.6 kg   SpO2 99%   BMI 31.01 kg/m   Physical Exam  Constitutional: He appears well-developed and well-nourished. No distress.  HENT:  Head: Normocephalic and atraumatic.  TMs with middle ear effusion bilaterally.  No erythema or bulging. Nasal septum midline with mucosal edema bilaterally, some dried blood noted along the septum. No active bleeding noted.  Posterior oropharynx unremarkable.  Tolerating secretions without difficulty.  Eyes: Pupils are equal, round, and reactive  to light. Conjunctivae and EOM are normal. Right eye exhibits no discharge. Left eye exhibits no discharge.  Neck: Normal range of motion. Neck supple. No JVD present. No tracheal deviation present.  Cardiovascular: Normal rate, regular rhythm, normal heart sounds and intact distal pulses.  Pulmonary/Chest: Effort normal and breath sounds normal. No stridor. No respiratory distress. He has no wheezes. He has no rales. He exhibits no tenderness.  Speaking in full sentences without difficulty, equal rise and fall of chest  Abdominal: Soft. Bowel sounds are normal. He exhibits no distension. There is no tenderness. There is no guarding.  Musculoskeletal: He exhibits no edema.  Neurological: He is alert.  Skin: Skin is warm and dry. No erythema.  Psychiatric: He has a normal mood and affect. His behavior is normal.  Nursing note and vitals reviewed.    ED Treatments / Results  Labs (all labs ordered are listed, but only abnormal results are displayed) Labs Reviewed - No data to display  EKG None  Radiology Dg Chest 2 View  Result Date: 09/04/2018 CLINICAL DATA:  Cough and body shakes x2 days.  Cold like symptoms. EXAM: CHEST - 2 VIEW COMPARISON:  08/31/2016 FINDINGS: The heart size and mediastinal contours are within normal limits. Both lungs are clear. The visualized skeletal structures are unremarkable. IMPRESSION: No active cardiopulmonary disease. Electronically Signed   By: Tollie Eth M.D.   On: 09/04/2018 16:47    Procedures Procedures (including critical care time)  Medications Ordered in ED Medications - No data to display   Initial Impression / Assessment and Plan / ED Course  I have reviewed the triage vital signs and the nursing notes.  Pertinent labs & imaging results that were available during my care of the patient were reviewed by me and considered in my medical decision making (see chart for details).     Patient with productive cough for 2 weeks.  Developed  some bloody sputum today.  He is afebrile, vital signs are stable.  He is nontoxic in appearance.  Tolerating secretions without difficulty.  Chest x-ray shows no evidence of acute cardiopulmonary abnormalities.  Lungs are clear to auscultation bilaterally.  Low suspicion of pneumonia.  Doubt PE as symptoms appear to be more infectious in etiology as his significant other has similar symptoms. No hypoxia, tachycardia, or increased work of breathing.  Recommend supportive/symptomatic care.  Discussed strict ED return precautions.  Recommend follow-up PCP if symptoms persist.  Patient and patient's significant other verbalized understanding of and agreement with plan and patient is stable for discharge home at this time.  Final Clinical Impressions(s) / ED Diagnoses   Final diagnoses:  Cough    ED Discharge Orders         Ordered    fluticasone (FLONASE) 50 MCG/ACT nasal spray  Daily  09/04/18 1750    benzonatate (TESSALON) 100 MG capsule  3 times daily PRN     09/04/18 1750           Jeanie Sewer, PA-C 09/04/18 1753    Vanetta Mulders, MD 09/06/18 1610

## 2018-09-04 NOTE — Discharge Instructions (Signed)
Drink plenty of fluids and get plenty of rest.  Use Flonase for nasal congestion.  Use Tessalon as needed for cough.  He can use other over-the-counter cold medicines.  Follow-up with primary care physician if symptoms persist.  Return to the emergency department if any concerning signs or symptoms develop such as shortness of breath, chest pains, fast heart rate, worsening coughing up blood, persistent vomiting, or severe abdominal pains.

## 2018-09-04 NOTE — ED Triage Notes (Signed)
Patient reports being seen at [facility] for "body shakiness" and cough 2 days ago. Patient reports having cold like symptoms he caught from his wife approx 1 month ago. Patient reports having a constant unproductive cough since then. Patient reports intermittent hemoptysis with cough. Patient denies fever/nausea/vomiting. Patient reports he has not been taking his daily allergy medicine this year. Patient managing airway without assistance and speaking in full sentences.

## 2018-12-11 ENCOUNTER — Emergency Department (HOSPITAL_COMMUNITY): Payer: Self-pay

## 2018-12-11 ENCOUNTER — Emergency Department (HOSPITAL_COMMUNITY)
Admission: EM | Admit: 2018-12-11 | Discharge: 2018-12-11 | Disposition: A | Discharge status: Home / Self Care | Payer: Self-pay | Attending: Emergency Medicine | Admitting: Emergency Medicine

## 2018-12-11 DIAGNOSIS — G8929 Other chronic pain: Secondary | ICD-10-CM

## 2018-12-11 DIAGNOSIS — F17210 Nicotine dependence, cigarettes, uncomplicated: Secondary | ICD-10-CM | POA: Insufficient documentation

## 2018-12-11 DIAGNOSIS — Z79899 Other long term (current) drug therapy: Secondary | ICD-10-CM | POA: Insufficient documentation

## 2018-12-11 DIAGNOSIS — Z7982 Long term (current) use of aspirin: Secondary | ICD-10-CM | POA: Insufficient documentation

## 2018-12-11 DIAGNOSIS — M25511 Pain in right shoulder: Secondary | ICD-10-CM | POA: Insufficient documentation

## 2018-12-11 DIAGNOSIS — E119 Type 2 diabetes mellitus without complications: Secondary | ICD-10-CM | POA: Insufficient documentation

## 2018-12-11 MED ORDER — NAPROXEN 500 MG PO TABS
500.0000 mg | ORAL_TABLET | Freq: Two times a day (BID) | ORAL | 0 refills | Status: DC
Start: 2018-12-11 — End: 2020-10-17

## 2018-12-11 MED ORDER — METHOCARBAMOL 500 MG PO TABS: 500.0000 mg | ORAL_TABLET | Freq: Three times a day (TID) | ORAL | 0 refills | Status: DC | PRN

## 2018-12-11 NOTE — ED Triage Notes (Signed)
Pt here from home with c/o right shoulder pain times 4 monthes , pt states that he does the same thing over and over at work , ibuprofen helps with the pain

## 2018-12-11 NOTE — Discharge Instructions (Addendum)
Please read and follow all provided instructions.  You have been seen today for right shoulder/elbow pain.   Tests performed today include: An x-ray of the affected area - does NOT show any broken bones or dislocations.  Vital signs. See below for your results today.   Medications:  - Naproxen is a nonsteroidal anti-inflammatory medication that will help with pain and swelling. Be sure to take this medication as prescribed with food, 1 pill every 12 hours,  It should be taken with food, as it can cause stomach upset, and more seriously, stomach bleeding. Do not take other nonsteroidal anti-inflammatory medications with this such as Advil, Motrin, Aleve, Mobic, Goodie Powder, or Motrin.    - Robaxin is the muscle relaxer I have prescribed, this is meant to help with muscle tightness. Be aware that this medication may make you drowsy therefore the first time you take this it should be at a time you are in an environment where you can rest. Do not drive or operate heavy machinery when taking this medication. Do not drink alcohol or take other sedating medications with this medicine such as narcotics or benzodiazepines.   You make take Tylenol per over the counter dosing with these medications.   We have prescribed you new medication(s) today. Discuss the medications prescribed today with your pharmacist as they can have adverse effects and interactions with your other medicines including over the counter and prescribed medications. Seek medical evaluation if you start to experience new or abnormal symptoms after taking one of these medicines, seek care immediately if you start to experience difficulty breathing, feeling of your throat closing, facial swelling, or rash as these could be indications of a more serious allergic reaction   Follow-up instructions: Please follow-up with your primary care provider or the provided orthopedic physician (bone specialist) if you continue to have significant pain  in 1 week. In this case you may have a more severe injury that requires further care.   Return instructions:  Please return if your digits or extremity are numb or tingling, appear gray or blue, or you have severe pain  Please return if you have redness or fevers.  Please return to the Emergency Department if you experience worsening symptoms.  Please return if you have any other emergent concerns. Additional Information:  Your vital signs today were: BP (!) 151/83    Pulse 82    Temp 98.3 F (36.8 C) (Oral)    Resp 18    SpO2 100%  If your blood pressure (BP) was elevated above 135/85 this visit, please have this repeated by your doctor within one month. ---------------

## 2018-12-11 NOTE — ED Notes (Signed)
Patient verbalizes understanding of discharge instructions. Opportunity for questioning and answers were provided. Armband removed by staff, pt discharged from ED ambulatory to home.  

## 2018-12-11 NOTE — ED Provider Notes (Addendum)
[facility] EMERGENCY DEPARTMENT Provider Note   CSN: [id number] Arrival date & time: 12/11/18  1457     History   Chief Complaint Chief Complaint  Patient presents with  . Shoulder Pain    HPI DANLEY ALARCON is a 43 y.o. male with a history of tobacco abuse, diabetes mellitus, and prior unknown hand surgery who presents to the emergency department with complaints of right upper extremity pain for the past several months. Patient states that the pain is primarily in the R shoulder and in the R elbow but can occur all the way to the hand.  He states that pain is constant, worse with movement, mildly alleviated with ibuprofen.  No specific injury.  He does a lot of repetitive motions at work.  No specific change that prompted ER visit today.  Denies fever, chills, numbness, weakness, or neck pain.  Denies chest pain or dyspnea.  HPI  Past Medical History:  Diagnosis Date  . Diabetes mellitus without complication (HCC)   . Environmental allergies   . Headache    hx of migraine in past    Patient Active Problem List   Diagnosis Date Noted  . New onset type 2 diabetes mellitus (HCC)   . Hypokalemia   . Near syncope   . Syncope 03/08/2015  . DM hyperosmolarity type II (HCC) 03/08/2015  . Hyperglycemia 03/08/2015    Past Surgical History:  Procedure Laterality Date  . HAND SURGERY          Home Medications    Prior to Admission medications   Medication Sig Start Date End Date Taking? Authorizing Provider  aspirin-acetaminophen-caffeine (EXCEDRIN MIGRAINE) [phone] MG tablet Take 2 tablets by mouth every 6 (six) hours as needed for headache.    [provider]  benzonatate (TESSALON) 100 MG capsule Take 1 capsule (100 mg total) by mouth 3 (three) times daily as needed for cough. 09/04/18   Fawze, Mina A, PA-C  fluticasone (FLONASE) 50 MCG/ACT nasal spray Place 2 sprays into both nostrils daily. 09/04/18   Luevenia Maxin, Mina A, PA-C  hydrocortisone  cream 1 % Apply to affected area 2 times daily 05/04/17   Jaynie Crumble, PA-C    Family History Family History  Problem Relation Age of Onset  . CAD Mother   . Diabetes type II Father     Social History Social History   Tobacco Use  . Smoking status: Current Some Day Smoker    Packs/day: 0.25    Years: 27.00    Pack years: 6.75  . Smokeless tobacco: Never Used  Substance Use Topics  . Alcohol use: Yes    Comment: ocassional  . Drug use: No     Allergies   Patient has no known allergies.   Review of Systems Review of Systems  Constitutional: Negative for chills and fever.  Respiratory: Negative for shortness of breath.   Cardiovascular: Negative for chest pain.  Musculoskeletal: Positive for arthralgias.  Skin: Negative for color change.  Neurological: Negative for weakness and numbness.     Physical Exam Updated Vital Signs BP (!) 151/83   Pulse 82   Temp 98.3 F (36.8 C) (Oral)   Resp 18   SpO2 100%   Physical Exam Vitals signs and nursing note reviewed.  Constitutional:      General: He is not in acute distress.    Appearance: He is well-developed.  HENT:     Head: Normocephalic and atraumatic.  Eyes:     General:  Right eye: No discharge.        Left eye: No discharge.     Conjunctiva/sclera: Conjunctivae normal.  Neck:     Musculoskeletal: Normal range of motion. Muscular tenderness (Right paraspinal muscles, specifically trapezius.) present.     Comments: No midline tenderness to palpation. Cardiovascular:     Rate and Rhythm: Normal rate and regular rhythm.     Pulses: Normal pulses.     Comments: 2+ symmetric radial pulses.  Pulmonary:     Effort: Pulmonary effort is normal.     Breath sounds: Normal breath sounds.  Musculoskeletal:     Comments: No obvious deformity, appreciable swelling, erythema, ecchymosis, warmth, or open wounds. Upper extremities: Patient has intact active range of motion to all joints of the left upper  extremity.  He is able to flex & abduct the right shoulder to about 90 degrees past this is limited secondary to pain, he has intact full extension.  He has normal active range of motion to the right elbow, wrist, and all digits.  He is tender to the right trapezius muscle, the diffuse glenohumeral joint, as well as the diffuse elbow joint.  There is no point/focal bony tenderness.  Upper extremities are otherwise nontender.  He is neurovascularly intact distally. Pain with R shoulder empty can  Neurological:     Mental Status: He is alert.     Comments: Clear speech.  Sensation grossly intact bilateral upper extremities.  5 out of 5 symmetric grip strength.  Psychiatric:        Behavior: Behavior normal.        Thought Content: Thought content normal.    ED Treatments / Results  Labs (all labs ordered are listed, but only abnormal results are displayed) Labs Reviewed - No data to display  EKG None  Radiology Dg Shoulder Right  Result Date: 12/11/2018 CLINICAL DATA:  43 y/o M; felt a pop of the right shoulder from the deltoid muscle down right arm with pain. EXAM: RIGHT SHOULDER - 2+ VIEW; RIGHT ELBOW - COMPLETE 3+ VIEW COMPARISON:  None. FINDINGS: Right shoulder: There is no evidence of fracture or dislocation. There is no evidence of arthropathy or other focal bone abnormality. Right elbow: No acute fracture, dislocation, or joint effusion. There is no evidence of arthropathy or other focal bone abnormality. IMPRESSION: No acute fracture or dislocation identified. Electronically Signed   By: Mitzi Hansen M.D.   On: 12/11/2018 16:19   Dg Elbow Complete Right  Result Date: 12/11/2018 CLINICAL DATA:  43 y/o M; felt a pop of the right shoulder from the deltoid muscle down right arm with pain. EXAM: RIGHT SHOULDER - 2+ VIEW; RIGHT ELBOW - COMPLETE 3+ VIEW COMPARISON:  None. FINDINGS: Right shoulder: There is no evidence of fracture or dislocation. There is no evidence of arthropathy  or other focal bone abnormality. Right elbow: No acute fracture, dislocation, or joint effusion. There is no evidence of arthropathy or other focal bone abnormality. IMPRESSION: No acute fracture or dislocation identified. Electronically Signed   By: Mitzi Hansen M.D.   On: 12/11/2018 16:19    Procedures Procedures (including critical care time)  Medications Ordered in ED Medications - No data to display   Initial Impression / Assessment and Plan / ED Course  I have reviewed the triage vital signs and the nursing notes.  Pertinent labs & imaging results that were available during my care of the patient were reviewed by me and considered in my medical decision making (  see chart for details).   Patient presents to the ED for right upper extremity pain that has been ongoing for several months.  His pain is most prominent in the right shoulder and somewhat to the right elbow.  He is nontoxic-appearing, no apparent distress, vitals with elevated BP, doubt HTN emergency.  There is no overlying erythema/warmth or fevers to suggest infectious etiology such as septic joint or cellulitis.  He is without chest pain or dyspnea, pain reproducible with palpation/ROM, doubt ACS/PE or other acute cardiopulmonary etiology. No edema, no known RFs, doubt DVT.  On exam some limitation with right shoulder flexion/abduction.  Tender to palpation to the right trapezius most prominently, also diffusely tender to the glenohumeral and elbow joint.  Neurovascularly intact distally. Xray negative for fracture/dislocation.  Suspect mostly muscular in etiology, possible rotator cuff issue.  Will prescribe naproxen and Robaxin, discussed no driving or operating heavy machinery with taking Robaxin.  Will have patient follow-up with orthopedics. I discussed results, treatment plan, need for follow-up, and return precautions with the patient. Provided opportunity for questions, patient confirmed understanding and is in  agreement with plan.    Final Clinical Impressions(s) / ED Diagnoses   Final diagnoses:  Chronic right shoulder pain    ED Discharge Orders         Ordered    naproxen (NAPROSYN) 500 MG tablet  2 times daily     12/11/18 1626    methocarbamol (ROBAXIN) 500 MG tablet  Every 8 hours PRN     12/11/18 1626           Karem Tomaso, [place]Samantha R, PA-C 12/11/18 1626    [address]Jaelie Aguilera, [place] Beach[place] R, PA-C 12/11/18 1629    Wynetta FinesMessick, Peter C, MD 12/11/18 1821

## 2018-12-31 ENCOUNTER — Other Ambulatory Visit: Payer: Self-pay

## 2018-12-31 ENCOUNTER — Encounter (HOSPITAL_COMMUNITY): Payer: Self-pay

## 2018-12-31 ENCOUNTER — Emergency Department (HOSPITAL_COMMUNITY)
Admission: EM | Admit: 2018-12-31 | Discharge: 2018-12-31 | Disposition: A | Discharge status: Home / Self Care | Payer: Self-pay | Attending: Emergency Medicine | Admitting: Emergency Medicine

## 2018-12-31 ENCOUNTER — Emergency Department (HOSPITAL_COMMUNITY): Payer: Self-pay

## 2018-12-31 DIAGNOSIS — K920 Hematemesis: Secondary | ICD-10-CM | POA: Insufficient documentation

## 2018-12-31 DIAGNOSIS — F17200 Nicotine dependence, unspecified, uncomplicated: Secondary | ICD-10-CM | POA: Insufficient documentation

## 2018-12-31 DIAGNOSIS — E119 Type 2 diabetes mellitus without complications: Secondary | ICD-10-CM | POA: Insufficient documentation

## 2018-12-31 LAB — CBC
HEMATOCRIT: 44.6 % (ref 39.0–52.0)
Hemoglobin: 14.2 g/dL (ref 13.0–17.0)
MCH: 27.7 pg (ref 26.0–34.0)
MCHC: 31.8 g/dL (ref 30.0–36.0)
MCV: 87.1 fL (ref 80.0–100.0)
Platelets: 253 10*3/uL (ref 150–400)
RBC: 5.12 MIL/uL (ref 4.22–5.81)
RDW: 14.4 % (ref 11.5–15.5)
WBC: 5.4 10*3/uL (ref 4.0–10.5)
nRBC: 0 % (ref 0.0–0.2)

## 2018-12-31 LAB — COMPREHENSIVE METABOLIC PANEL
ALK PHOS: 60 U/L (ref 38–126)
ALT: 23 U/L (ref 0–44)
ANION GAP: 6 (ref 5–15)
AST: 23 U/L (ref 15–41)
Albumin: 4.2 g/dL (ref 3.5–5.0)
BILIRUBIN TOTAL: 0.5 mg/dL (ref 0.3–1.2)
BUN: 16 mg/dL (ref 6–20)
CHLORIDE: 109 mmol/L (ref 98–111)
CO2: 23 mmol/L (ref 22–32)
Calcium: 9.1 mg/dL (ref 8.9–10.3)
Creatinine, Ser: 0.99 mg/dL (ref 0.61–1.24)
GLUCOSE: 108 mg/dL — AB (ref 70–99)
Potassium: 4.3 mmol/L (ref 3.5–5.1)
Sodium: 138 mmol/L (ref 135–145)
Total Protein: 7.6 g/dL (ref 6.5–8.1)

## 2018-12-31 LAB — URINALYSIS, ROUTINE W REFLEX MICROSCOPIC
BILIRUBIN URINE: NEGATIVE
GLUCOSE, UA: NEGATIVE mg/dL
HGB URINE DIPSTICK: NEGATIVE
KETONES UR: NEGATIVE mg/dL
Leukocytes, UA: NEGATIVE
Nitrite: NEGATIVE
PH: 6 (ref 5.0–8.0)
Protein, ur: NEGATIVE mg/dL
SPECIFIC GRAVITY, URINE: 1.024 (ref 1.005–1.030)

## 2018-12-31 LAB — LIPASE, BLOOD: Lipase: 65 U/L — ABNORMAL HIGH (ref 11–51)

## 2018-12-31 MED ORDER — SUCRALFATE 1 G PO TABS: 1.0000 g | ORAL_TABLET | Freq: Four times a day (QID) | ORAL | 0 refills | Status: DC | PRN

## 2018-12-31 MED ORDER — SODIUM CHLORIDE 0.9% FLUSH: 3.0000 mL | Freq: Once | INTRAVENOUS | Status: DC

## 2018-12-31 MED ORDER — OMEPRAZOLE 20 MG PO CPDR: 20.0000 mg | DELAYED_RELEASE_CAPSULE | Freq: Every day | ORAL | 1 refills | Status: DC

## 2018-12-31 NOTE — ED Triage Notes (Signed)
Pt reports that he got nauseous and coughed up some blood about 2 hours ago. He also reports a scratchy throat starting then. Airway patent. Denies vomiting, but endorses some discomfort in stomach. Also reports belching. A&Ox4.

## 2018-12-31 NOTE — ED Provider Notes (Signed)
[facility] Emergency Department Provider Note MRN:  [id number]  Arrival date & time: 12/31/18     Chief Complaint   Hematemesis History of Present Illness   Herbert Barnes is a 43 y.o. year-old male with a history of diabetes presenting to the ED with chief complaint of hematemesis.  Patient explains that he ate a large meal from [employer] prior to going to work.  At 7 PM he experienced some sudden onset nausea, went to the bathroom, and then began spitting up some saliva mixed with some specks of blood.  Denies coughing, no chest pain or shortness of breath, no abdominal pain.  No recent fever.  No black stools, normal stools.  Review of Systems  A complete 10 system review of systems was obtained and all systems are negative except as noted in the HPI and PMH.   Patient's Health History    Past Medical History:  Diagnosis Date  . Diabetes mellitus without complication (HCC)   . Environmental allergies   . Headache    hx of migraine in past    Past Surgical History:  Procedure Laterality Date  . HAND SURGERY      Family History  Problem Relation Age of Onset  . CAD Mother   . Diabetes type II Father     Social History   Socioeconomic History  . Marital status: Legally Separated    Spouse name: Not on file  . Number of children: Not on file  . Years of education: Not on file  . Highest education level: Not on file  Occupational History  . Not on file  Social Needs  . Financial resource strain: Not on file  . Food insecurity:    Worry: Not on file    Inability: Not on file  . Transportation needs:    Medical: Not on file    Non-medical: Not on file  Tobacco Use  . Smoking status: Current Some Day Smoker    Packs/day: 0.25    Years: 27.00    Pack years: 6.75  . Smokeless tobacco: Never Used  Substance and Sexual Activity  . Alcohol use: Yes    Comment: ocassional  . Drug use: No  . Sexual activity: Yes    Birth control/protection:  None  Lifestyle  . Physical activity:    Days per week: Not on file    Minutes per session: Not on file  . Stress: Not on file  Relationships  . Social connections:    Talks on phone: Not on file    Gets together: Not on file    Attends religious service: Not on file    Active member of club or organization: Not on file    Attends meetings of clubs or organizations: Not on file    Relationship status: Not on file  . Intimate partner violence:    Fear of current or ex partner: Not on file    Emotionally abused: Not on file    Physically abused: Not on file    Forced sexual activity: Not on file  Other Topics Concern  . Not on file  Social History Narrative  . Not on file     Physical Exam  Vital Signs and Nursing Notes reviewed Vitals:   12/31/18 1958 12/31/18 2203  BP: (!) 149/84 135/83  Pulse: 82 77  Resp: 18 18  Temp: 98.2 F (36.8 C)   SpO2: 99% 99%    CONSTITUTIONAL: Well-appearing, NAD NEURO:  Alert  and oriented x 3, no focal deficits EYES:  eyes equal and reactive ENT/NECK:  no LAD, no JVD CARDIO: Regular rate, well-perfused, normal S1 and S2 PULM:  CTAB no wheezing or rhonchi GI/GU:  normal bowel sounds, non-distended, non-tender MSK/SPINE:  No gross deformities, no edema SKIN:  no rash, atraumatic PSYCH:  Appropriate speech and behavior  Diagnostic and Interventional Summary    Labs Reviewed  LIPASE, BLOOD - Abnormal; Notable for the following components:      Result Value   Lipase 65 (*)    All other components within normal limits  COMPREHENSIVE METABOLIC PANEL - Abnormal; Notable for the following components:   Glucose, Bld 108 (*)    All other components within normal limits  CBC  URINALYSIS, ROUTINE W REFLEX MICROSCOPIC    DG ABD ACUTE 2+V W 1V CHEST  Final Result      Medications  sodium chloride flush (NS) 0.9 % injection 3 mL (3 mLs Intravenous Not Given 12/31/18 2302)     Procedures Critical Care  ED Course and Medical Decision  Making  I have reviewed the triage vital signs and the nursing notes.  Pertinent labs & imaging results that were available during my care of the patient were reviewed by me and considered in my medical decision making (see below for details).  Specks of hematemesis, favored due to mild gastritis.  Patient has a history of GERD is not taking any medications.  Labs reassuring, patient is well-appearing, benign abdomen, screening abdominal series pending.  X-ray unremarkable.  Patient was initially triaged as hemoptysis, but denies any cough, no chest pain, no shortness of breath, nothing to suggest pulmonary embolism or any other pulmonary etiology.  Appropriate for discharge with GERD management.  Strict return precautions for worsening bleeding.  After the discussed management above, the patient was determined to be safe for discharge.  The patient was in agreement with this plan and all questions regarding their care were answered.  ED return precautions were discussed and the patient will return to the ED with any significant worsening of condition.  Elmer SowMichael M. Pilar PlateBero, MD [facility] [email or web address]  Final Clinical Impressions(s) / ED Diagnoses     ICD-10-CM   1. Hematemesis K92.0 DG ABD ACUTE 2+V W 1V CHEST    DG ABD ACUTE 2+V W 1V CHEST    ED Discharge Orders         Ordered    omeprazole (PRILOSEC) 20 MG capsule  Daily     12/31/18 2346    sucralfate (CARAFATE) 1 g tablet  4 times daily PRN     12/31/18 2346             Sabas SousBero, Shakeema Lippman M, MD 12/31/18 2348

## 2018-12-31 NOTE — ED Notes (Signed)
Pt explained he needed to change into gown for MD to do proper assessment.

## 2018-12-31 NOTE — Discharge Instructions (Addendum)
You were evaluated in the Emergency Department and after careful evaluation, we did not find any emergent condition requiring admission or further testing in the hospital.  Your symptoms today seem to be due to inflammation of the stomach related to stomach acid.  Your x-rays and your labs today were very reassuring.  Please use the medications provided as directed and follow-up with your primary care doctor.  Please return to the Emergency Department if you experience any worsening of your condition.  We encourage you to follow up with a primary care provider.  Thank you for allowing [place] to be a part of your care.

## 2019-09-17 IMAGING — CR DG ABDOMEN ACUTE W/ 1V CHEST
3 series · 3 of 3 positions shown · non-contrast
Comparison: Chest 09/04/2018

CLINICAL DATA: Nausea. Cough with hematemesis. History of diabetes.
Current smoker.

EXAM:
DG ABDOMEN ACUTE W/ 1V CHEST

[w chest pa]
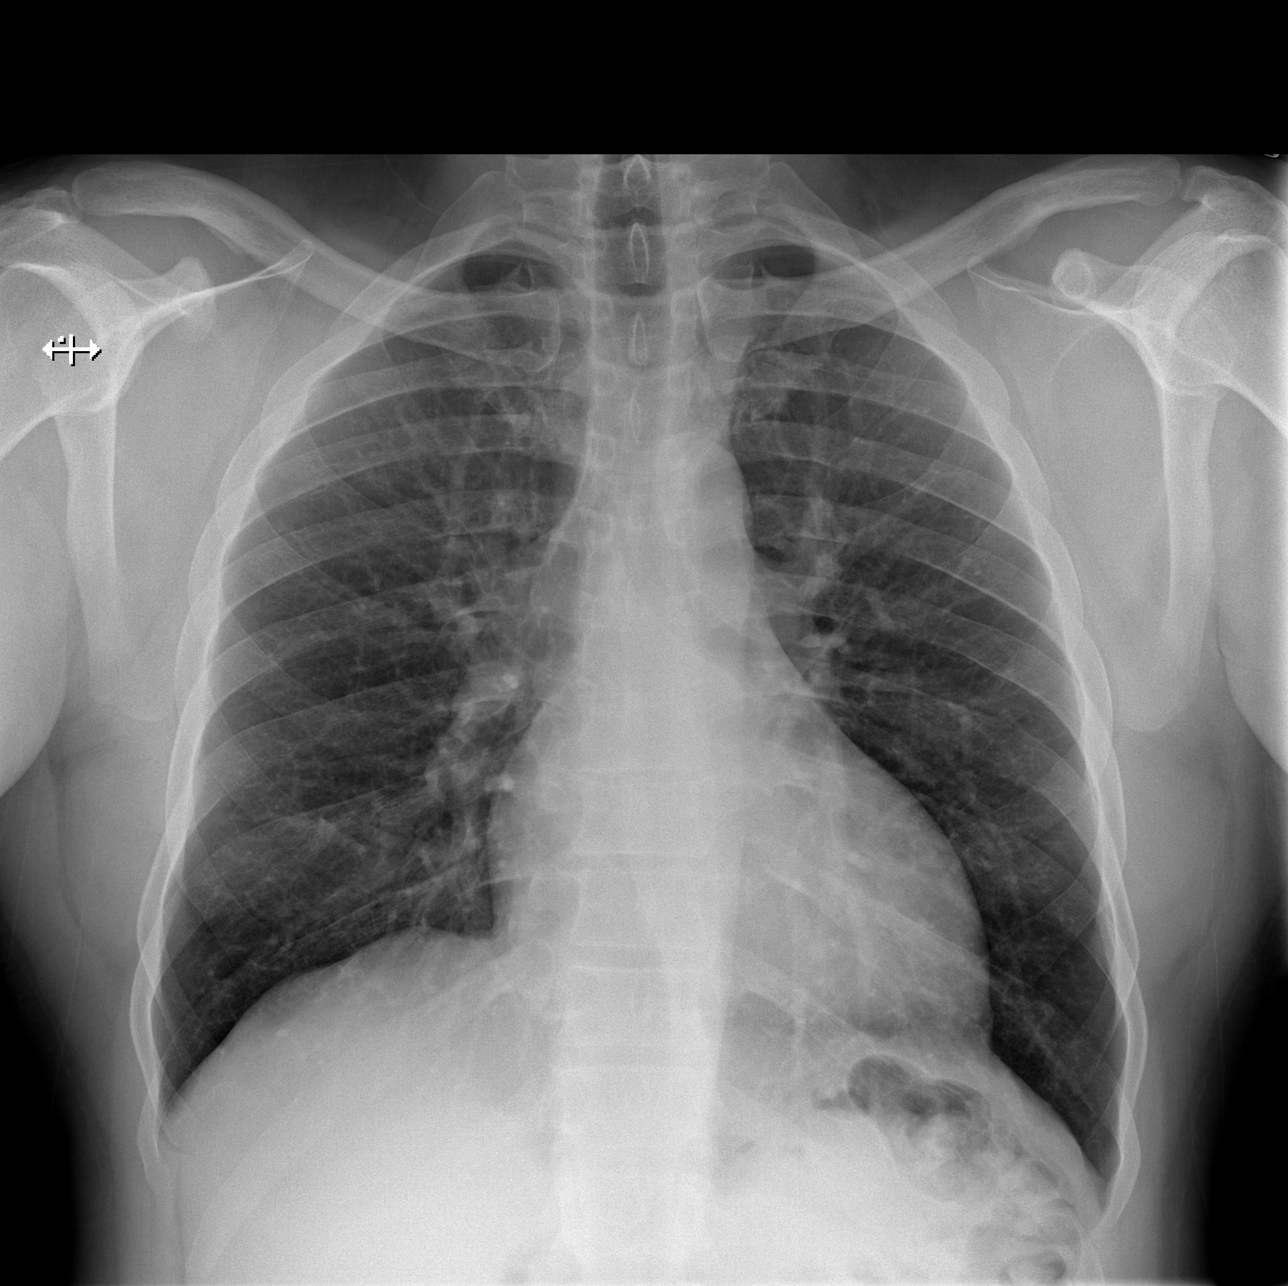

[w abdomen upright]
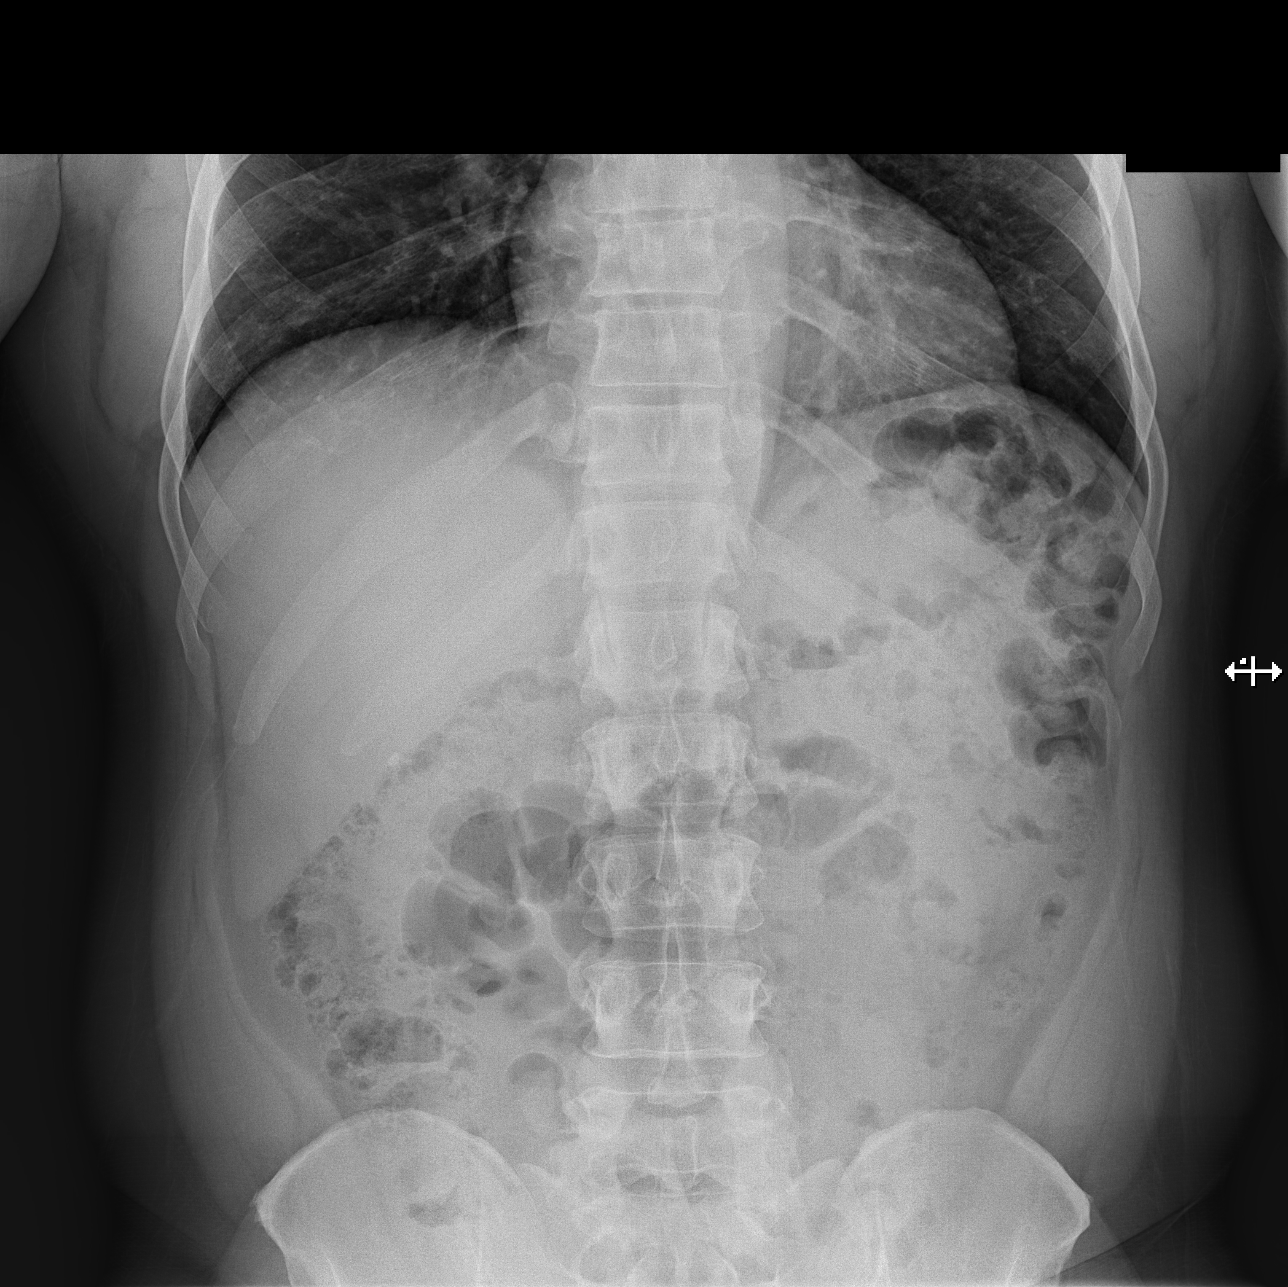

[t abdomen supine]
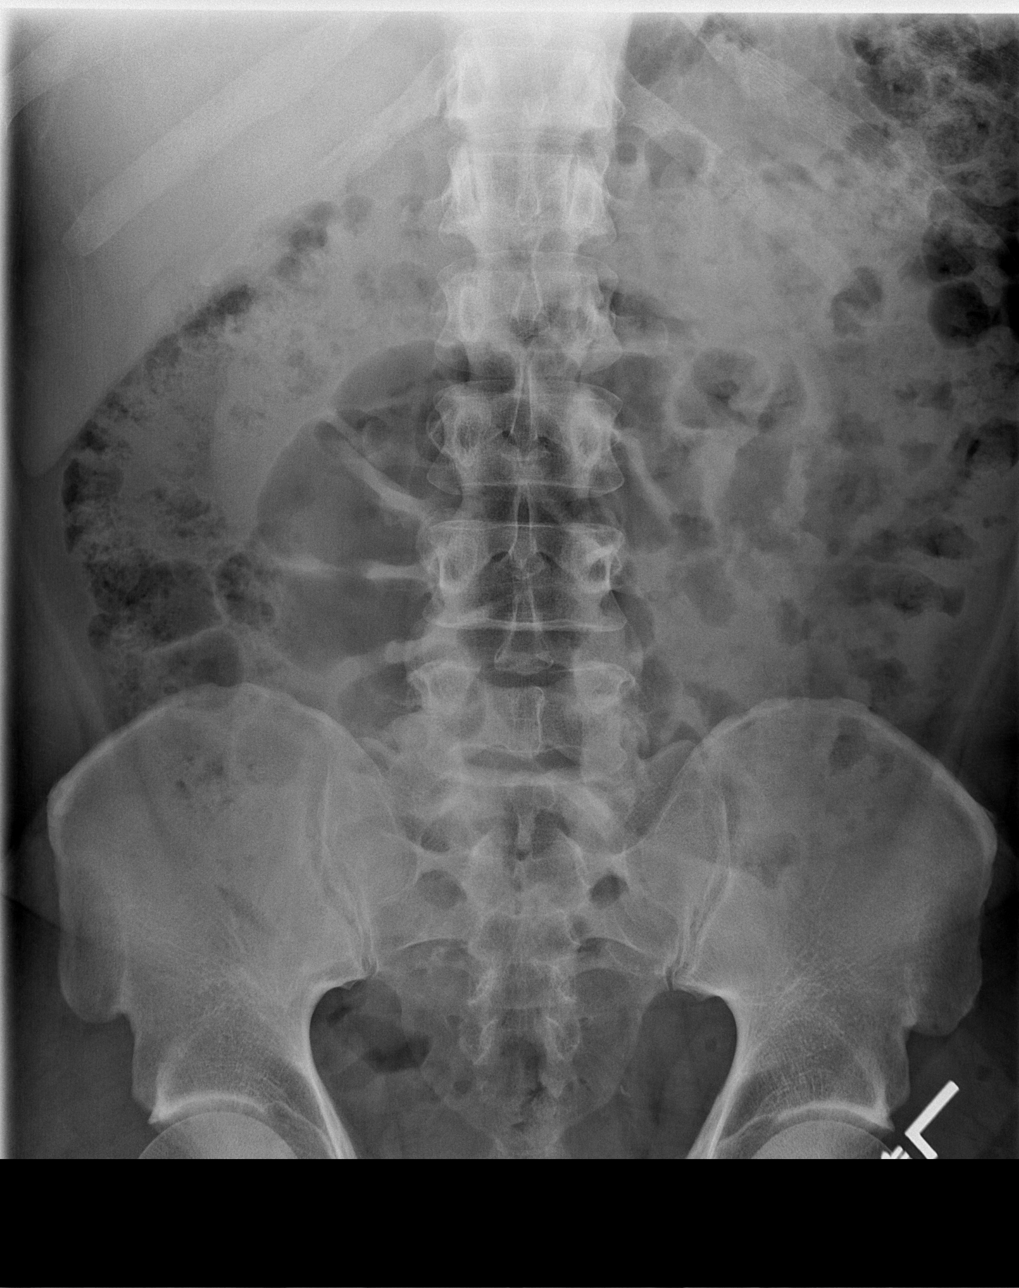

[3 of 3 positions shown; findings below may reference images not displayed]

FINDINGS: Normal heart size and pulmonary vascularity. No focal airspace
disease or consolidation in the lungs. No blunting of costophrenic
angles. No pneumothorax. Mediastinal contours appear intact.

Gas and stool throughout the colon. No small or large bowel
distention. No free intra-abdominal air. No abnormal air-fluid
levels. No radiopaque stones. Visualized bones and soft tissue
contours appear intact.
IMPRESSION: No evidence of active pulmonary disease. Normal nonobstructive bowel
gas pattern.

## 2020-08-18 ENCOUNTER — Encounter (HOSPITAL_BASED_OUTPATIENT_CLINIC_OR_DEPARTMENT_OTHER): Payer: Self-pay

## 2020-08-18 ENCOUNTER — Other Ambulatory Visit: Payer: Self-pay

## 2020-08-18 ENCOUNTER — Emergency Department (HOSPITAL_BASED_OUTPATIENT_CLINIC_OR_DEPARTMENT_OTHER)
Admission: EM | Admit: 2020-08-18 | Discharge: 2020-08-19 | Disposition: A | Discharge status: Home / Self Care | Payer: Self-pay | Attending: Emergency Medicine | Admitting: Emergency Medicine

## 2020-08-18 DIAGNOSIS — E1100 Type 2 diabetes mellitus with hyperosmolarity without nonketotic hyperglycemic-hyperosmolar coma (NKHHC): Secondary | ICD-10-CM | POA: Insufficient documentation

## 2020-08-18 DIAGNOSIS — F17210 Nicotine dependence, cigarettes, uncomplicated: Secondary | ICD-10-CM | POA: Insufficient documentation

## 2020-08-18 DIAGNOSIS — H9202 Otalgia, left ear: Secondary | ICD-10-CM | POA: Insufficient documentation

## 2020-08-18 MED ORDER — IBUPROFEN 800 MG PO TABS
800.0000 mg | ORAL_TABLET | Freq: Once | ORAL | Status: DC
  Filled 2020-08-18: qty 1

## 2020-08-18 NOTE — ED Triage Notes (Signed)
Pt c/o left earache x 2 weeks-last tylenol 45 min PTA-NAD-steady gait

## 2020-08-19 ENCOUNTER — Encounter (HOSPITAL_BASED_OUTPATIENT_CLINIC_OR_DEPARTMENT_OTHER): Payer: Self-pay | Admitting: Emergency Medicine

## 2020-08-19 MED ORDER — CIPROFLOXACIN-DEXAMETHASONE 0.3-0.1 % OT SUSP: 4.0000 [drp] | Freq: Two times a day (BID) | OTIC | 0 refills | Status: DC

## 2020-08-19 MED ORDER — FLUTICASONE PROPIONATE 50 MCG/ACT NA SUSP: 2.0000 | Freq: Every day | NASAL | 0 refills | Status: DC

## 2020-08-19 NOTE — ED Provider Notes (Signed)
[facility] EMERGENCY DEPARTMENT Provider Note   CSN: [id number] Arrival date & time: 08/18/20  2155     History Chief Complaint  Patient presents with  . Otalgia    Herbert Barnes is a 44 y.o. male.  The history is provided by the patient.  Otalgia Location:  Left Behind ear:  No abnormality Quality:  Aching Severity:  Moderate Onset quality:  Gradual Duration:  2 weeks Timing:  Constant Progression:  Unchanged Chronicity:  New Context: not direct blow, not elevation change, not loud noise and not water in ear   Relieved by:  Nothing Worsened by:  Nothing Ineffective treatments:  None tried Associated symptoms: no abdominal pain, no congestion, no cough, no fever, no rash and no tinnitus   Risk factors: no recent travel        Past Medical History:  Diagnosis Date  . Diabetes mellitus without complication (HCC)   . Environmental allergies   . Headache    hx of migraine in past    Patient Active Problem List   Diagnosis Date Noted  . New onset type 2 diabetes mellitus (HCC)   . Hypokalemia   . Near syncope   . Syncope 03/08/2015  . DM hyperosmolarity type II (HCC) 03/08/2015  . Hyperglycemia 03/08/2015    Past Surgical History:  Procedure Laterality Date  . HAND SURGERY         Family History  Problem Relation Age of Onset  . CAD Mother   . Diabetes type II Father     Social History   Tobacco Use  . Smoking status: Current Some Day Smoker    Packs/day: 0.25    Years: 27.00    Pack years: 6.75  . Smokeless tobacco: Never Used  Vaping Use  . Vaping Use: Never used  Substance Use Topics  . Alcohol use: Yes    Comment: occ  . Drug use: No    Home Medications Prior to Admission medications   Medication Sig Start Date End Date Taking? Authorizing Provider  aspirin-acetaminophen-caffeine (EXCEDRIN MIGRAINE) [phone] MG tablet Take 2 tablets by mouth every 6 (six) hours as needed for headache.    [provider]    benzonatate (TESSALON) 100 MG capsule Take 1 capsule (100 mg total) by mouth 3 (three) times daily as needed for cough. Patient not taking: Reported on 12/31/2018 09/04/18   Michela Pitcher A, PA-C  fluticasone (FLONASE) 50 MCG/ACT nasal spray Place 2 sprays into both nostrils daily. Patient not taking: Reported on 12/31/2018 09/04/18   Michela Pitcher A, PA-C  hydrocortisone cream 1 % Apply to affected area 2 times daily Patient not taking: Reported on 12/31/2018 05/04/17   Jaynie Crumble, PA-C  methocarbamol (ROBAXIN) 500 MG tablet Take 1 tablet (500 mg total) by mouth every 8 (eight) hours as needed for muscle spasms. Patient not taking: Reported on 12/31/2018 12/11/18   Petrucelli, Lelon Mast R, PA-C  naproxen (NAPROSYN) 500 MG tablet Take 1 tablet (500 mg total) by mouth 2 (two) times daily. Patient not taking: Reported on 12/31/2018 12/11/18   Petrucelli, Pleas Koch, PA-C  omeprazole (PRILOSEC) 20 MG capsule Take 1 capsule (20 mg total) by mouth daily. 12/31/18   Sabas Sous, MD  sucralfate (CARAFATE) 1 g tablet Take 1 tablet (1 g total) by mouth 4 (four) times daily as needed. 12/31/18   Sabas Sous, MD    Allergies    Codeine  Review of Systems   Review of Systems  Constitutional: Negative  for fever.  HENT: Positive for ear pain. Negative for congestion, dental problem, drooling, postnasal drip and tinnitus.   Eyes: Negative for visual disturbance.  Respiratory: Negative for cough.   Cardiovascular: Negative for chest pain.  Gastrointestinal: Negative for abdominal pain.  Genitourinary: Negative for difficulty urinating.  Musculoskeletal: Negative for arthralgias.  Skin: Negative for rash.  Neurological: Negative for dizziness.  Psychiatric/Behavioral: Negative for agitation.  All other systems reviewed and are negative.   Physical Exam Updated Vital Signs BP (!) 163/83 (BP Location: Left Arm)   Pulse 92   Temp 98.5 F (36.9 C) (Oral)   Resp 20   Ht 6\' 1"  (1.854 m)   Wt 106.1 kg    SpO2 98%   BMI 30.87 kg/m   Physical Exam Vitals and nursing note reviewed.  Constitutional:      General: He is not in acute distress.    Appearance: Normal appearance.  HENT:     Head: Normocephalic and atraumatic.     Left Ear: Tympanic membrane and ear canal normal. There is no impacted cerumen.     Nose: Nose normal.  Eyes:     Conjunctiva/sclera: Conjunctivae normal.     Pupils: Pupils are equal, round, and reactive to light.  Cardiovascular:     Rate and Rhythm: Normal rate and regular rhythm.     Pulses: Normal pulses.     Heart sounds: Normal heart sounds.  Pulmonary:     Effort: Pulmonary effort is normal.     Breath sounds: Normal breath sounds.  Abdominal:     General: Abdomen is flat. Bowel sounds are normal.     Palpations: Abdomen is soft.     Tenderness: There is no abdominal tenderness. There is no guarding or rebound.  Musculoskeletal:        General: Normal range of motion.     Cervical back: Normal range of motion and neck supple.  Skin:    General: Skin is warm and dry.     Capillary Refill: Capillary refill takes less than 2 seconds.  Neurological:     General: No focal deficit present.     Mental Status: He is alert and oriented to person, place, and time.     Deep Tendon Reflexes: Reflexes normal.  Psychiatric:        Mood and Affect: Mood normal.        Behavior: Behavior normal.     ED Results / Procedures / Treatments   Labs (all labs ordered are listed, but only abnormal results are displayed) Labs Reviewed - No data to display  EKG None  Radiology No results found.  Procedures Procedures (including critical care time)  Medications Ordered in ED Medications  ibuprofen (ADVIL) tablet 800 mg (800 mg Oral Not Given 08/18/20 2358)    ED Course  I have reviewed the triage vital signs and the nursing notes.  Pertinent labs & imaging results that were available during my care of the patient were reviewed by me and considered in my  medical decision making (see chart for details).   I suspect Eustachian tube dysfunction.  Start zyrtec daily and flonase BID.  No signs of infection, no indication for oral antibiotics.  If symptoms persist, follow up with ENT.    TRAVIAN KERNER was evaluated in Emergency Department on 08/19/2020 for the symptoms described in the history of present illness. He was evaluated in the context of the global COVID-19 pandemic, which necessitated consideration that the patient might be  at risk for infection with the SARS-CoV-2 virus that causes COVID-19. Institutional protocols and algorithms that pertain to the evaluation of patients at risk for COVID-19 are in a state of rapid change based on information released by regulatory bodies including the CDC and federal and state organizations. These policies and algorithms were followed during the patient's care in the ED.  Final Clinical Impression(s) / ED Diagnoses Return for intractable cough, coughing up blood,fevers >100.4 unrelieved by medication, shortness of breath, intractable vomiting, chest pain, shortness of breath, weakness,numbness, changes in speech, facial asymmetry,abdominal pain, passing out,Inability to tolerate liquids or food, cough, altered mental status or any concerns. No signs of systemic illness or infection. The patient is nontoxic-appearing on exam and vital signs are within normal limits.   I have reviewed the triage vital signs and the nursing notes. Pertinent labs &imaging results that were available during my care of the patient were reviewed by me and considered in my medical decision making (see chart for details).After history, exam, and medical workup I feel the patient has beenappropriately medically screened and is safe for discharge home. Pertinent diagnoses were discussed with the patient. Patient was given return precautions.      Donney Caraveo, MD 08/19/20 [id number]

## 2020-10-17 ENCOUNTER — Encounter (HOSPITAL_BASED_OUTPATIENT_CLINIC_OR_DEPARTMENT_OTHER): Payer: Self-pay

## 2020-10-17 ENCOUNTER — Other Ambulatory Visit: Payer: Self-pay

## 2020-10-17 ENCOUNTER — Emergency Department (HOSPITAL_BASED_OUTPATIENT_CLINIC_OR_DEPARTMENT_OTHER)
Admission: EM | Admit: 2020-10-17 | Discharge: 2020-10-17 | Disposition: A | Discharge status: Home / Self Care | Payer: Self-pay | Attending: Emergency Medicine | Admitting: Emergency Medicine

## 2020-10-17 DIAGNOSIS — E1165 Type 2 diabetes mellitus with hyperglycemia: Secondary | ICD-10-CM | POA: Insufficient documentation

## 2020-10-17 DIAGNOSIS — F17210 Nicotine dependence, cigarettes, uncomplicated: Secondary | ICD-10-CM | POA: Insufficient documentation

## 2020-10-17 DIAGNOSIS — B353 Tinea pedis: Secondary | ICD-10-CM | POA: Insufficient documentation

## 2020-10-17 LAB — CBG MONITORING, ED: Glucose-Capillary: 170 mg/dL — ABNORMAL HIGH (ref 70–99)

## 2020-10-17 MED ORDER — CLOTRIMAZOLE 1 % EX CREA: TOPICAL_CREAM | CUTANEOUS | 0 refills | Status: DC

## 2020-10-17 NOTE — ED Provider Notes (Signed)
[facility] EMERGENCY DEPARTMENT Provider Note   CSN: [id number] Arrival date & time: 10/17/20  1452     History Chief Complaint  Patient presents with  . Foot Pain    Herbert Barnes is a 44 y.o. male with pertinent past medical history of diabetes 4 years ago, patient states that he has not had diabetes for the past 4 years that presents the emergency department today for left foot skin infection.  Patient states that he started having symptoms about 3 to 4 months ago on his left foot on his 4th toe.  Patient states that he bought a pair of boots from [employer] which had been previously owned by someone else and he then started noticing this infection.  Has been trying over-the-counter athlete's foot cream without much relief.  Patient states that it is hard to put his boots on without pain.  Pain does not radiate anywhere, no numbness or tingling.  Denies any fevers, chills, nausea, vomiting.  Normal strength, no weakness.  No recent antibiotics.  States that he is generally healthy, does not take any medications.  Denies any known abrasions to his foot.  No other complaints.  HPI     Past Medical History:  Diagnosis Date  . Diabetes mellitus without complication (HCC)   . Environmental allergies   . Headache    hx of migraine in past    Patient Active Problem List   Diagnosis Date Noted  . New onset type 2 diabetes mellitus (HCC)   . Hypokalemia   . Near syncope   . Syncope 03/08/2015  . DM hyperosmolarity type II (HCC) 03/08/2015  . Hyperglycemia 03/08/2015    Past Surgical History:  Procedure Laterality Date  . HAND SURGERY         Family History  Problem Relation Age of Onset  . CAD Mother   . Diabetes type II Father     Social History   Tobacco Use  . Smoking status: Current Some Day Smoker    Packs/day: 0.25    Years: 27.00    Pack years: 6.75  . Smokeless tobacco: Never Used  Vaping Use  . Vaping Use: Never used  Substance Use Topics    . Alcohol use: Yes    Comment: occ  . Drug use: No    Home Medications Prior to Admission medications   Medication Sig Start Date End Date Taking? Authorizing Provider  aspirin-acetaminophen-caffeine (EXCEDRIN MIGRAINE) [phone] MG tablet Take 2 tablets by mouth every 6 (six) hours as needed for headache.    [provider]  benzonatate (TESSALON) 100 MG capsule Take 1 capsule (100 mg total) by mouth 3 (three) times daily as needed for cough. Patient not taking: Reported on 12/31/2018 09/04/18   Michela Pitcher A, PA-C  clotrimazole (LOTRIMIN) 1 % cream Apply to affected area 2 times daily 10/17/20   Farrel Gordon, PA-C  fluticasone [facility]) 50 MCG/ACT nasal spray Place 2 sprays into both nostrils daily. 08/19/20   Palumbo, April, MD  methocarbamol (ROBAXIN) 500 MG tablet Take 1 tablet (500 mg total) by mouth every 8 (eight) hours as needed for muscle spasms. Patient not taking: Reported on 12/31/2018 12/11/18   Petrucelli, Pleas Koch, PA-C  omeprazole (PRILOSEC) 20 MG capsule Take 1 capsule (20 mg total) by mouth daily. 12/31/18   Sabas Sous, MD  sucralfate (CARAFATE) 1 g tablet Take 1 tablet (1 g total) by mouth 4 (four) times daily as needed. 12/31/18   Sabas Sous,  MD    Allergies    Codeine  Review of Systems   Review of Systems  Constitutional: Negative for diaphoresis, fatigue and fever.  Eyes: Negative for visual disturbance.  Respiratory: Negative for shortness of breath.   Cardiovascular: Negative for chest pain.  Gastrointestinal: Negative for nausea and vomiting.  Musculoskeletal: Negative for back pain and myalgias.  Skin: Positive for rash. Negative for color change, pallor and wound.  Neurological: Negative for syncope, weakness, light-headedness, numbness and headaches.  Psychiatric/Behavioral: Negative for behavioral problems and confusion.    Physical Exam Updated Vital Signs BP (!) 160/93 (BP Location: Left Arm)   Pulse 88   Temp 99 F (37.2 C)  (Oral)   Resp 18   Ht 6\' 1"  (1.854 m)   Wt 102.5 kg   SpO2 100%   BMI 29.82 kg/m   Physical Exam Constitutional:      General: He is not in acute distress.    Appearance: Normal appearance. He is not ill-appearing, toxic-appearing or diaphoretic.  Cardiovascular:     Rate and Rhythm: Normal rate and regular rhythm.     Pulses: Normal pulses.  Pulmonary:     Effort: Pulmonary effort is normal.     Breath sounds: Normal breath sounds.  Musculoskeletal:        General: Normal range of motion.  Skin:    General: Skin is warm and dry.     Capillary Refill: Capillary refill takes less than 2 seconds.     Comments: Patient with erythematous rash to 4th toe that extends about 1 cm into his distal foot.  On the right side.  No satellite lesions, no infection between toes.  There does appear to be a distinct border consistent with tinea pedis with some scaling.  Neurological:     General: No focal deficit present.     Mental Status: He is alert and oriented to person, place, and time.  Psychiatric:        Mood and Affect: Mood normal.        Behavior: Behavior normal.        Thought Content: Thought content normal.     ED Results / Procedures / Treatments   Labs (all labs ordered are listed, but only abnormal results are displayed) Labs Reviewed - No data to display  EKG None  Radiology No results found.  Procedures Procedures (including critical care time)  Medications Ordered in ED Medications - No data to display  ED Course  I have reviewed the triage vital signs and the nursing notes.  Pertinent labs & imaging results that were available during my care of the patient were reviewed by me and considered in my medical decision making (see chart for details).    MDM Rules/Calculators/A&P                          Herbert Barnes is a 44 y.o. male with pertinent past medical history of diabetes 4 years ago, patient states that he has not had diabetes for the past 4  years that presents the emergency department today for left foot skin infection.  Presentation most likely tinea pedis, Motrin prescribed, patient to follow-up with podiatrist.  Patient is distally neurovascularly intact, patient to be discharged at this time.  Doubt need for further emergent work up at this time. I explained the diagnosis and have given explicit precautions to return to the ER including for any other new or worsening symptoms.  The patient understands and accepts the medical plan as it's been dictated and I have answered their questions. Discharge instructions concerning home care and prescriptions have been given. The patient is STABLE and is discharged to home in good condition   Final Clinical Impression(s) / ED Diagnoses Final diagnoses:  Tinea pedis of left foot    Rx / DC Orders ED Discharge Orders         Ordered    clotrimazole (LOTRIMIN) 1 % cream        10/17/20 1556           Farrel Gordon, PA-C 10/17/20 1614    Tilden Fossa, MD 10/17/20 1858

## 2020-10-17 NOTE — Discharge Instructions (Addendum)
You are seen today for fungal infection of your foot, as we discussed I want you to try this cream for the next 2 weeks, if there is no improvement please come back here.  Can also go to the podiatrist as we discussed, if you have any new or worsening concerning symptoms please come back to the emergency department.  Please use the attached instructions.  Please be sure pharmacy about any new medications prescribed in regards to side effects or interactions.  Your blood sugar was 170, I want this to be evaluated by your primary care in the next couple of days.

## 2020-10-17 NOTE — ED Triage Notes (Signed)
Pt c/o left foot pain, skin peeling, bleeding x 3-4 months-sx started after wearing a used pair of boots-NAD-steady gait

## 2020-11-29 ENCOUNTER — Emergency Department (HOSPITAL_BASED_OUTPATIENT_CLINIC_OR_DEPARTMENT_OTHER)
Admission: EM | Admit: 2020-11-29 | Discharge: 2020-11-29 | Disposition: A | Discharge status: Home / Self Care | Payer: Self-pay | Attending: Emergency Medicine | Admitting: Emergency Medicine

## 2020-11-29 ENCOUNTER — Emergency Department (HOSPITAL_BASED_OUTPATIENT_CLINIC_OR_DEPARTMENT_OTHER): Payer: Self-pay

## 2020-11-29 ENCOUNTER — Encounter (HOSPITAL_BASED_OUTPATIENT_CLINIC_OR_DEPARTMENT_OTHER): Payer: Self-pay

## 2020-11-29 ENCOUNTER — Other Ambulatory Visit: Payer: Self-pay

## 2020-11-29 DIAGNOSIS — E119 Type 2 diabetes mellitus without complications: Secondary | ICD-10-CM | POA: Insufficient documentation

## 2020-11-29 DIAGNOSIS — R42 Dizziness and giddiness: Secondary | ICD-10-CM | POA: Insufficient documentation

## 2020-11-29 DIAGNOSIS — F17210 Nicotine dependence, cigarettes, uncomplicated: Secondary | ICD-10-CM | POA: Insufficient documentation

## 2020-11-29 DIAGNOSIS — Z7982 Long term (current) use of aspirin: Secondary | ICD-10-CM | POA: Insufficient documentation

## 2020-11-29 DIAGNOSIS — R519 Headache, unspecified: Secondary | ICD-10-CM | POA: Insufficient documentation

## 2020-11-29 NOTE — ED Triage Notes (Signed)
Pt reports headache, dizziness, lightheadedness and nausea that started approximately 45 mins ago. Pt states he was at work when he developed pain behind his R eye, then nausea and then dizziness/ lightheadedness. Pt states he has a hx of migraines. Pt states this feels like a migraine but its not the typical symptoms.

## 2020-11-29 NOTE — ED Provider Notes (Signed)
[place] EMERGENCY DEPARTMENT Provider Note   CSN: [id number] Arrival date & time: 11/29/20  1820     History Chief Complaint  Patient presents with  . Headache  . Dizziness    Herbert Barnes is a 45 y.o. male.  The history is provided by the patient and medical records.  Headache Associated symptoms: dizziness   Dizziness Associated symptoms: headaches    Herbert Barnes is a 45 y.o. male who presents to the Emergency Department complaining of HA.  He presents to the ED complaining of HA that started around 5pm.  Pain is located behind his right eye.  It was sharp and sudden in onset. He was at work when it happened and called his significant other, who brought him to the emergency department. When he got to the emergency department he fell asleep. At time of death valuation patient states that his headache is significantly improved. He denies any vision changes, fevers, numbness, weakness, chest pain, abdominal pain, shortness of breath. He did have nausea, no vomiting. He does have a history of migraine headaches but this is slightly different compared to his usual migraine. He does have mild dizziness that is overall improving.  He has a remote history of diabetes but is currently not on medications due to dietary changes.    Past Medical History:  Diagnosis Date  . Diabetes mellitus without complication ([place])   . Environmental allergies   . Headache    hx of migraine in past    Patient Active Problem List   Diagnosis Date Noted  . New onset type 2 diabetes mellitus ([place])   . Hypokalemia   . Near syncope   . Syncope 03/08/2015  . DM hyperosmolarity type II ([place]) 03/08/2015  . Hyperglycemia 03/08/2015    Past Surgical History:  Procedure Laterality Date  . HAND SURGERY         Family History  Problem Relation Age of Onset  . CAD Mother   . Diabetes type II Father     Social History   Tobacco Use  . Smoking status: Current Some Day Smoker     Packs/day: 0.25    Years: 27.00    Pack years: 6.75  . Smokeless tobacco: Never Used  Vaping Use  . Vaping Use: Never used  Substance Use Topics  . Alcohol use: Yes    Comment: occ  . Drug use: No    Home Medications Prior to Admission medications   Medication Sig Start Date End Date Taking? Authorizing Provider  clotrimazole (LOTRIMIN) 1 % cream Apply to affected area 2 times daily 10/17/20  Yes Alfredia Client, PA-C  aspirin-acetaminophen-caffeine (EXCEDRIN MIGRAINE) [phone] MG tablet Take 2 tablets by mouth every 6 (six) hours as needed for headache.    [provider]  benzonatate (TESSALON) 100 MG capsule Take 1 capsule (100 mg total) by mouth 3 (three) times daily as needed for cough. Patient not taking: No sig reported 09/04/18   Rodell Perna A, PA-C  fluticasone (FLONASE) 50 MCG/ACT nasal spray Place 2 sprays into both nostrils daily. 08/19/20   Palumbo, April, MD  methocarbamol (ROBAXIN) 500 MG tablet Take 1 tablet (500 mg total) by mouth every 8 (eight) hours as needed for muscle spasms. Patient not taking: No sig reported 12/11/18   Petrucelli, Samantha R, PA-C  omeprazole (PRILOSEC) 20 MG capsule Take 1 capsule (20 mg total) by mouth daily. 12/31/18   Maudie Flakes, MD  sucralfate (CARAFATE) 1 g tablet Take  1 tablet (1 g total) by mouth 4 (four) times daily as needed. 12/31/18   Sabas Sous, MD    Allergies    Codeine  Review of Systems   Review of Systems  Neurological: Positive for dizziness and headaches.  All other systems reviewed and are negative.   Physical Exam Updated Vital Signs BP 139/79 (BP Location: Right Arm)   Pulse 68   Temp 98 F (36.7 C) (Oral)   Resp 16   Ht 6\' 1"  (1.854 m)   Wt 104.8 kg   SpO2 98%   BMI 30.48 kg/m   Physical Exam Vitals and nursing note reviewed.  Constitutional:      Appearance: He is well-developed and well-nourished.  HENT:     Head: Normocephalic and atraumatic.  Eyes:     Extraocular Movements:  Extraocular movements intact.     Pupils: Pupils are equal, round, and reactive to light.  Cardiovascular:     Rate and Rhythm: Normal rate and regular rhythm.     Heart sounds: No murmur heard.   Pulmonary:     Effort: Pulmonary effort is normal. No respiratory distress.     Breath sounds: Normal breath sounds.  Abdominal:     Palpations: Abdomen is soft.     Tenderness: There is no abdominal tenderness. There is no guarding or rebound.  Musculoskeletal:        General: No tenderness or edema.  Skin:    General: Skin is warm and dry.  Neurological:     Mental Status: He is alert and oriented to person, place, and time.     Comments: No asymmetry of facial movement.  5/5 strength in all four extremities with sensation to light touch intact in all four extremities.   Psychiatric:        Mood and Affect: Mood and affect normal.        Behavior: Behavior normal.     ED Results / Procedures / Treatments   Labs (all labs ordered are listed, but only abnormal results are displayed) Labs Reviewed - No data to display  EKG None  Radiology CT Head Wo Contrast  Result Date: 11/29/2020 CLINICAL DATA:  Headache, dizziness and nausea. EXAM: CT HEAD WITHOUT CONTRAST TECHNIQUE: Contiguous axial images were obtained from the base of the skull through the vertex without intravenous contrast. COMPARISON:  March 08, 2015 FINDINGS: Brain: No evidence of acute infarction, hemorrhage, hydrocephalus, extra-axial collection or mass lesion/mass effect. Vascular: No hyperdense vessel or unexpected calcification. Skull: Normal. Negative for fracture or focal lesion. Sinuses/Orbits: No acute finding. Other: None. IMPRESSION: No acute intracranial pathology. Electronically Signed   By: March 10, 2015 M.D.   On: 11/29/2020 19:25    Procedures Procedures (including critical care time)  Medications Ordered in ED Medications - No data to display  ED Course  I have reviewed the triage vital signs and  the nursing notes.  Pertinent labs & imaging results that were available during my care of the patient were reviewed by me and considered in my medical decision making (see chart for details).    MDM Rules/Calculators/A&P                          Patient here for evaluation of headache, significantly improved at time of evaluation the emergency department. He has no focal neurologic deficits. He is non-toxic appearing. Presentation is not consistent with CVA, subarachnoid hemorrhage, meningitis. Discussed with patient home care for headache.  Discussed outpatient follow-up and return precautions. Final Clinical Impression(s) / ED Diagnoses Final diagnoses:  Bad headache    Rx / DC Orders ED Discharge Orders    None       Tilden Fossa, MD 11/29/20 2327

## 2021-08-16 IMAGING — CT CT HEAD W/O CM
3 series · 15 of 47 positions shown, 18 images · non-contrast
Comparison: March 08, 2015

CLINICAL DATA: Headache, dizziness and nausea.

EXAM:
CT HEAD WITHOUT CONTRAST
TECHNIQUE: Contiguous axial images were obtained from the base of the skull
through the vertex without intravenous contrast.

[Series 2: head wo · axial · 0.47mm/px · z∈[-344,-214]mm · 9 of 32 slices shown, 12 images]
[im 3/32  brain]
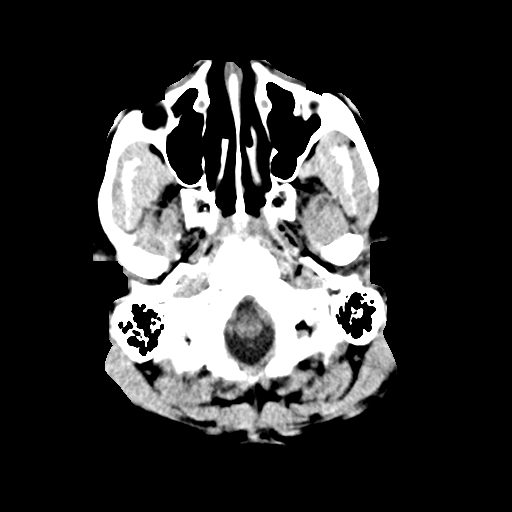
[im 3/32  bone]
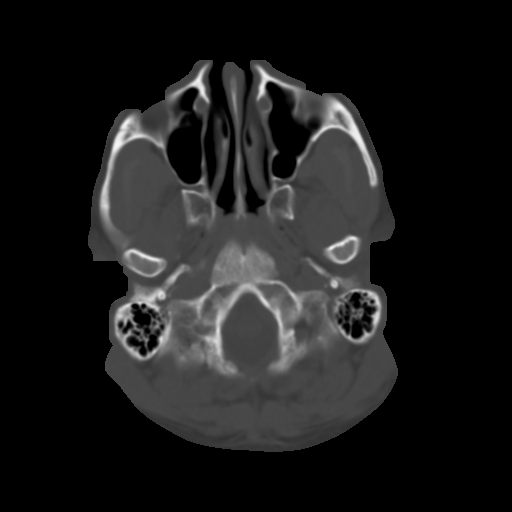
[im 6/32  brain]
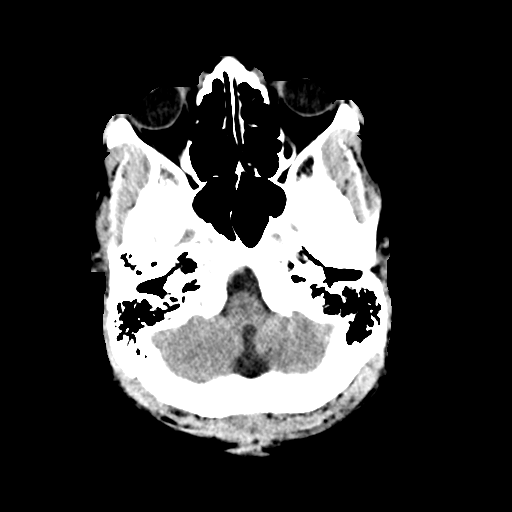
[im 9/32  brain]
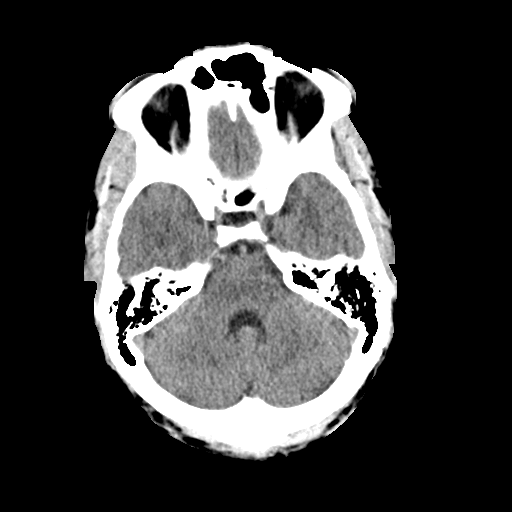
[im 12/32  brain]
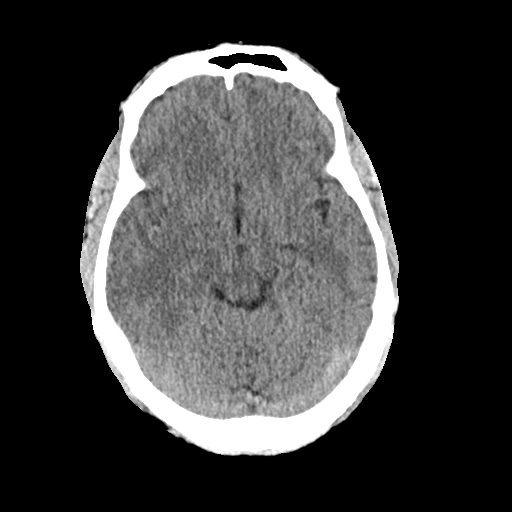
[im 17/32  brain]
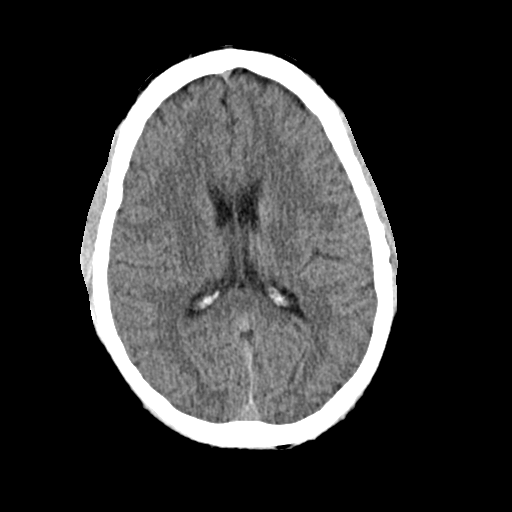
[im 17/32  bone]
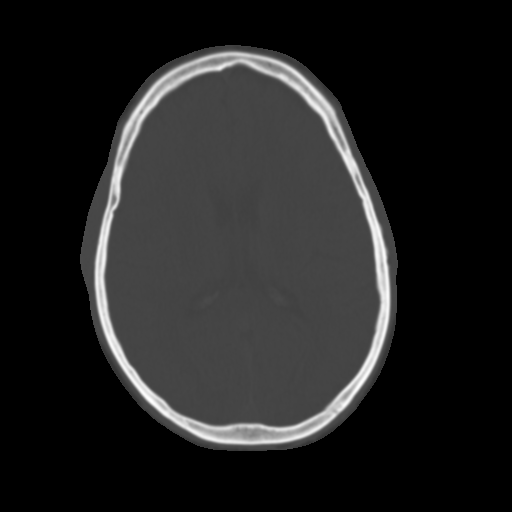
[im 20/32  brain]
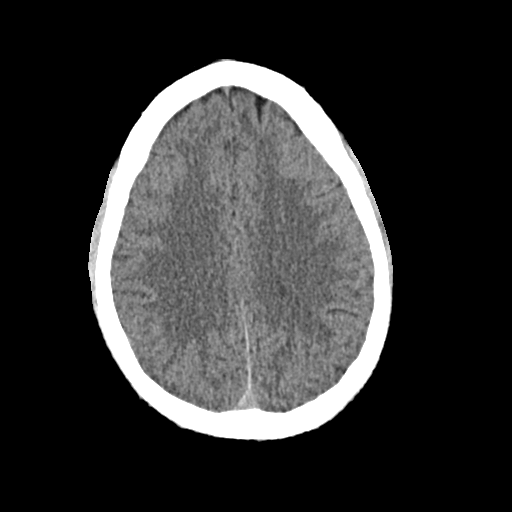
[im 23/32  brain]
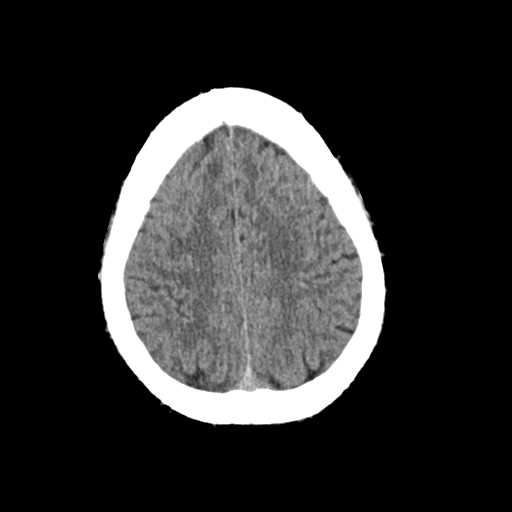
[im 26/32  brain]
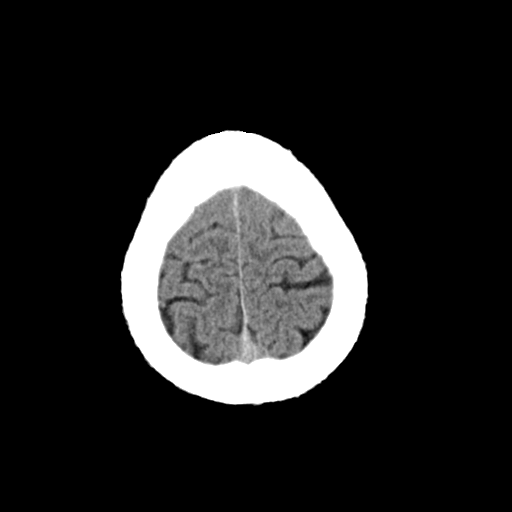
[im 29/32  brain]
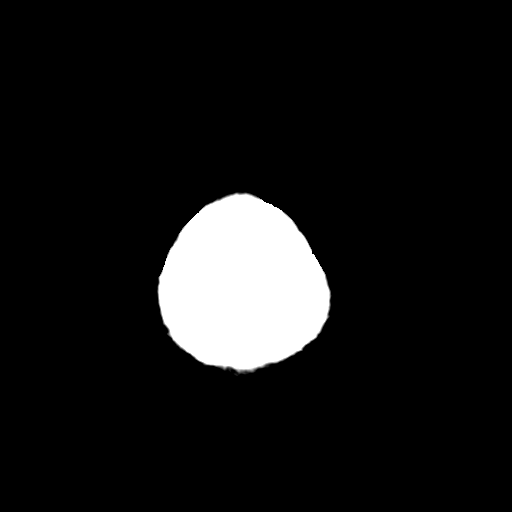
[im 29/32  bone]
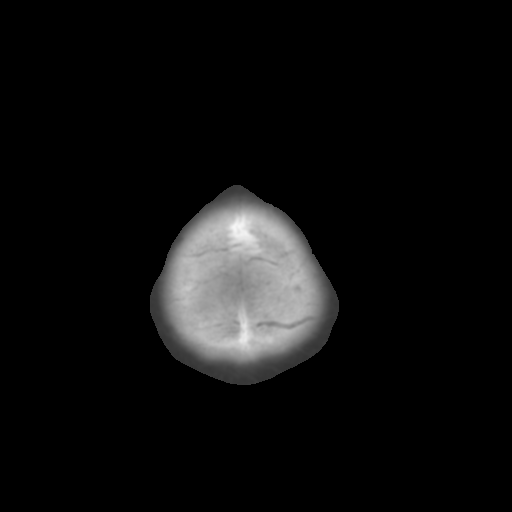

[Series 4: coronal soft · coronal · 0.32mm/px · 3 of 73 slices shown]
[im 25/73  brain]
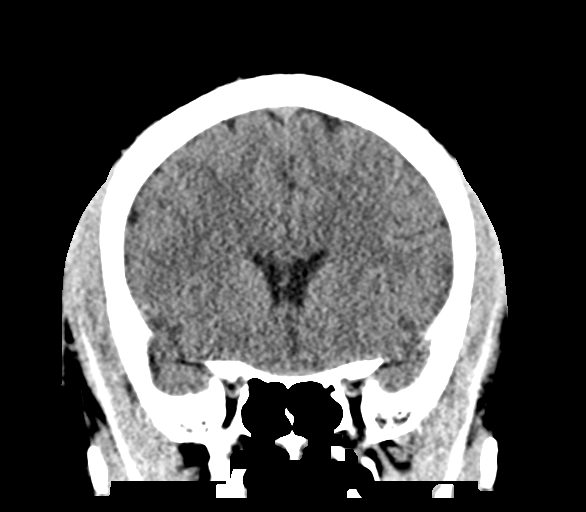
[im 33/73  brain]
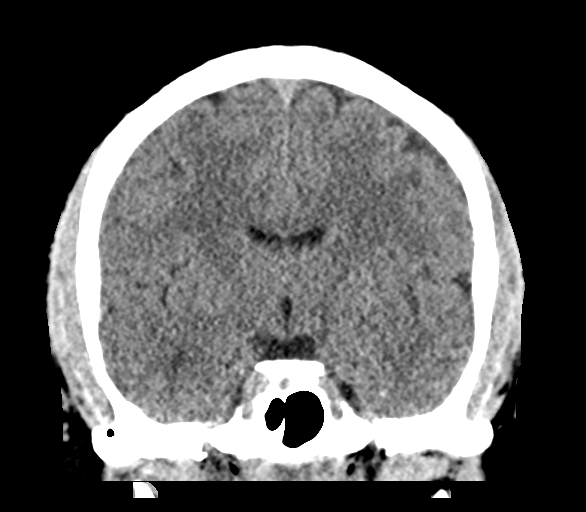
[im 41/73  brain]
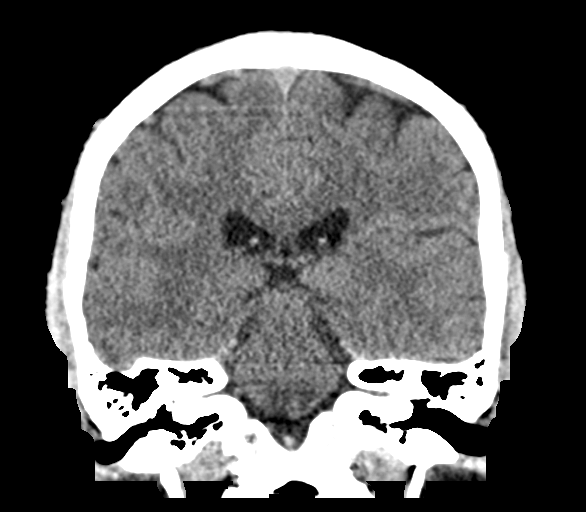

[Series 5: sag soft · sagittal · 0.31mm/px · 3 of 59 slices shown]
[im 20/59  brain]
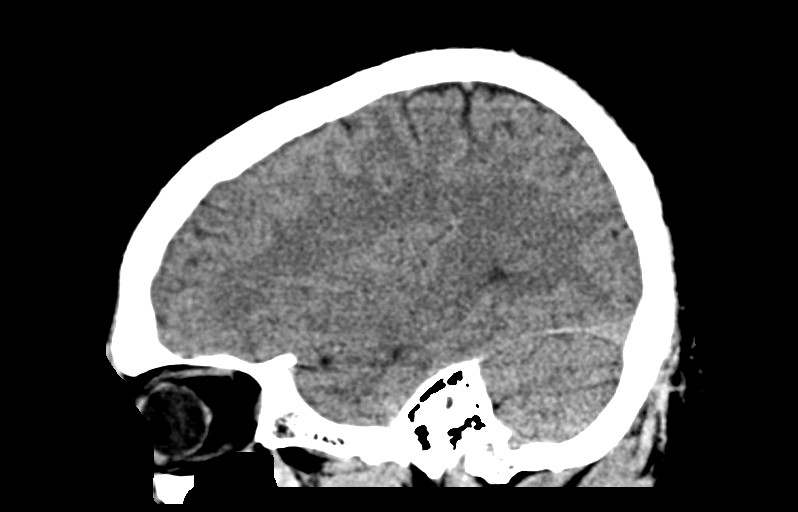
[im 30/59  brain]
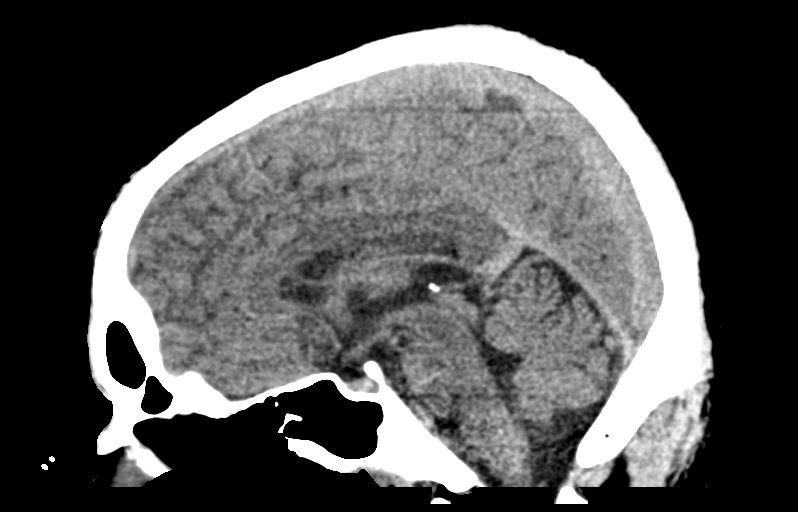
[im 39/59  brain]
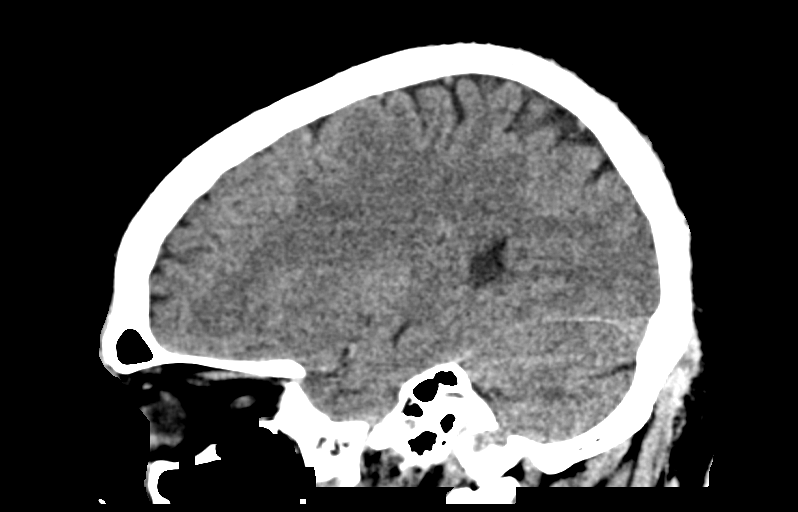

[15 of 47 positions shown; findings below may reference images not displayed]

FINDINGS: Brain: No evidence of acute infarction, hemorrhage, hydrocephalus,
extra-axial collection or mass lesion/mass effect.

Vascular: No hyperdense vessel or unexpected calcification.

Skull: Normal. Negative for fracture or focal lesion.

Sinuses/Orbits: No acute finding.

Other: None.
IMPRESSION: No acute intracranial pathology.

## 2022-01-09 ENCOUNTER — Emergency Department (HOSPITAL_BASED_OUTPATIENT_CLINIC_OR_DEPARTMENT_OTHER)
Admission: EM | Admit: 2022-01-09 | Discharge: 2022-01-09 | Disposition: A | Discharge status: Home / Self Care | Payer: Self-pay | Attending: Emergency Medicine | Admitting: Emergency Medicine

## 2022-01-09 ENCOUNTER — Encounter (HOSPITAL_BASED_OUTPATIENT_CLINIC_OR_DEPARTMENT_OTHER): Payer: Self-pay | Admitting: Emergency Medicine

## 2022-01-09 ENCOUNTER — Other Ambulatory Visit: Payer: Self-pay

## 2022-01-09 DIAGNOSIS — R0981 Nasal congestion: Secondary | ICD-10-CM | POA: Insufficient documentation

## 2022-01-09 DIAGNOSIS — Z79899 Other long term (current) drug therapy: Secondary | ICD-10-CM | POA: Insufficient documentation

## 2022-01-09 DIAGNOSIS — Z7982 Long term (current) use of aspirin: Secondary | ICD-10-CM | POA: Insufficient documentation

## 2022-01-09 DIAGNOSIS — Z7951 Long term (current) use of inhaled steroids: Secondary | ICD-10-CM | POA: Insufficient documentation

## 2022-01-09 DIAGNOSIS — Z20822 Contact with and (suspected) exposure to covid-19: Secondary | ICD-10-CM | POA: Insufficient documentation

## 2022-01-09 DIAGNOSIS — E119 Type 2 diabetes mellitus without complications: Secondary | ICD-10-CM | POA: Insufficient documentation

## 2022-01-09 DIAGNOSIS — M791 Myalgia, unspecified site: Secondary | ICD-10-CM | POA: Insufficient documentation

## 2022-01-09 LAB — GROUP A STREP BY PCR: Group A Strep by PCR: NOT DETECTED

## 2022-01-09 LAB — RESP PANEL BY RT-PCR (FLU A&B, COVID) ARPGX2
Influenza A by PCR: NEGATIVE
Influenza B by PCR: NEGATIVE
SARS Coronavirus 2 by RT PCR: NEGATIVE

## 2022-01-09 MED ORDER — AMOXICILLIN-POT CLAVULANATE 875-125 MG PO TABS: 1.0000 | ORAL_TABLET | Freq: Two times a day (BID) | ORAL | 0 refills | Status: DC

## 2022-01-09 MED ORDER — FLUTICASONE PROPIONATE 50 MCG/ACT NA SUSP: 2.0000 | Freq: Every day | NASAL | 0 refills | Status: AC

## 2022-01-09 MED ORDER — AMOXICILLIN-POT CLAVULANATE 875-125 MG PO TABS
1.0000 | ORAL_TABLET | Freq: Once | ORAL | Status: AC
  Administered 2022-01-09: 1 via ORAL
  Filled 2022-01-09: qty 1

## 2022-01-09 MED ORDER — OXYMETAZOLINE HCL 0.05 % NA SOLN: 1.0000 | Freq: Two times a day (BID) | NASAL | 0 refills | Status: DC

## 2022-01-09 NOTE — ED Provider Notes (Signed)
[facility] Provider Note   CSN: [id number] Arrival date & time: 01/09/22  1936     History Chief Complaint  Patient presents with   Generalized Body Aches    Herbert Barnes is a 46 y.o. male with no significant past medical history who presents to the emergency department with a 14-day history of nasal congestion, general malaise, myalgias, intermittent cough, and sore throat.  He denies any abdominal pain, chest pain, nausea, vomiting, diarrhea.  Patient been taking DayQuil and halls with little improvement.  Has been worsening over the last 2 days.  States he initially got better and is now been getting worse.  HPI     Home Medications Prior to Admission medications   Medication Sig Start Date End Date Taking? Authorizing Provider  amoxicillin-clavulanate (AUGMENTIN) 875-125 MG tablet Take 1 tablet by mouth every 12 (twelve) hours. 01/09/22  Yes Raul Del, Marnell Mcdaniel M, PA-C  fluticasone (FLONASE) 50 MCG/ACT nasal spray Place 2 sprays into both nostrils daily. 01/09/22  Yes Raul Del, Darryon Bastin M, PA-C  oxymetazoline (AFRIN NASAL SPRAY) 0.05 % nasal spray Place 1 spray into both nostrils 2 (two) times daily. 01/09/22  Yes Raul Del, Messiyah Waterson M, PA-C  aspirin-acetaminophen-caffeine (EXCEDRIN MIGRAINE) [phone] MG tablet Take 2 tablets by mouth every 6 (six) hours as needed for headache.    [provider]  benzonatate (TESSALON) 100 MG capsule Take 1 capsule (100 mg total) by mouth 3 (three) times daily as needed for cough. Patient not taking: No sig reported 09/04/18   Rodell Perna A, PA-C  clotrimazole (LOTRIMIN) 1 % cream Apply to affected area 2 times daily 10/17/20   Alfredia Client, PA-C  methocarbamol (ROBAXIN) 500 MG tablet Take 1 tablet (500 mg total) by mouth every 8 (eight) hours as needed for muscle spasms. Patient not taking: No sig reported 12/11/18   Petrucelli, Samantha R, PA-C  omeprazole (PRILOSEC) 20 MG capsule Take 1 capsule (20 mg total) by  mouth daily. 12/31/18   Maudie Flakes, MD  sucralfate (CARAFATE) 1 g tablet Take 1 tablet (1 g total) by mouth 4 (four) times daily as needed. 12/31/18   Maudie Flakes, MD      Allergies    Codeine    Review of Systems   Review of Systems  All other systems reviewed and are negative.  Physical Exam Updated Vital Signs BP (!) 146/92 (BP Location: Right Arm)    Pulse 90    Temp 98.9 F (37.2 C) (Oral)    Resp 18    Ht 6\' 1"  (1.854 m)    Wt 106.6 kg    SpO2 100%    BMI 31.00 kg/m  Physical Exam Vitals and nursing note reviewed.  Constitutional:      General: He is not in acute distress.    Appearance: Normal appearance.  HENT:     Head: Normocephalic and atraumatic.  Eyes:     General:        Right eye: No discharge.        Left eye: No discharge.  Cardiovascular:     Comments: Regular rate and rhythm.  S1/S2 are distinct without any evidence of murmur, rubs, or gallops.  Radial pulses are 2+ bilaterally.  Dorsalis pedis pulses are 2+ bilaterally.  No evidence of pedal edema. Pulmonary:     Comments: Clear to auscultation bilaterally.  Normal effort.  No respiratory distress.  No evidence of wheezes, rales, or rhonchi heard throughout. Abdominal:     General:  Abdomen is flat. Bowel sounds are normal. There is no distension.     Tenderness: There is no abdominal tenderness. There is no guarding or rebound.  Musculoskeletal:        General: Normal range of motion.     Cervical back: Neck supple.  Skin:    General: Skin is warm and dry.     Findings: No rash.  Neurological:     General: No focal deficit present.     Mental Status: He is alert.  Psychiatric:        Mood and Affect: Mood normal.        Behavior: Behavior normal.    ED Results / Procedures / Treatments   Labs (all labs ordered are listed, but only abnormal results are displayed) Labs Reviewed  RESP PANEL BY RT-PCR (FLU A&B, COVID) ARPGX2  GROUP A STREP BY PCR    EKG None  Radiology No results  found.  Procedures Procedures    Medications Ordered in ED Medications  amoxicillin-clavulanate (AUGMENTIN) 875-125 MG per tablet 1 tablet (1 tablet Oral Given 01/09/22 2159)    ED Course/ Medical Decision Making/ A&P                           Medical Decision Making Risk Prescription drug management.   This patient presents to the ED for concern of upper respiratory infection, this involves an extensive number of treatment options, and is a complaint that carries with it a high risk of complications and morbidity.  The differential diagnosis includes viral versus bacterial etiology, strep throat, pneumonia, sinusitis, COVID, flu.   Co morbidities that complicate the patient evaluation  Diabetes   Additional history obtained:   External records from outside source obtained and reviewed including old ED records.  No relevant information.   Lab Tests:  I Ordered, and personally interpreted labs.  The pertinent results include: Respiratory panel.  Negative for COVID and flu.  Negative strep.   Cardiac Monitoring:  The patient was maintained on a cardiac monitor.  I personally viewed and interpreted the cardiac monitored which showed an underlying rhythm of: Elevated blood pressure.  Normal sinus rhythm.  Oxygenating well on room air.   Medicines ordered and prescription drug management:  I ordered medication including Augmentin for presumed bacterial infection Reevaluation of the patient after these medicines showed that the patient stayed the same I have reviewed the patients home medicines and have made adjustments as needed   Test Considered:  Chest x-ray.  He is lungs were clear to auscultation.  Patient has been having no fevers.  I have a low suspicion for pneumonia.   Critical Interventions:  Augmentin for presumed bacterial infection given the bimodal course.  Pharmacy is likely closed.  We will give him initial dose here.   Problem List / ED  Course:  Cough and nasal congestion Diabetes   Reevaluation:  After the interventions noted above, I reevaluated the patient and found that they have :stayed the same   Social Determinants of Health:  Patient smokes half pack a day. No primary care provider.   Dispostion:  After consideration of the diagnostic results and the patients response to treatment, I feel that the patent would benefit from outpatient follow-up.  I will write him a 7-day course of Augmentin considering the bimodal and time course.  I will also write him a prescription for Afrin and fluticasone.  I gave him strict instructions not to  use the Afrin for longer than 3 days as this can cause rebound congestion and there is addictive potential.  Patient understood.  I will also write him out for the rest the week.  Patient minimal this plan.  Strict return precautions given.  He is a for discharge.   Final Clinical Impression(s) / ED Diagnoses Final diagnoses:  Nasal congestion  Myalgia    Rx / DC Orders ED Discharge Orders          Ordered    amoxicillin-clavulanate (AUGMENTIN) 875-125 MG tablet  Every 12 hours        01/09/22 2201    oxymetazoline (AFRIN NASAL SPRAY) 0.05 % nasal spray  2 times daily        01/09/22 2201    fluticasone (FLONASE) 50 MCG/ACT nasal spray  Daily        01/09/22 2201              Hendricks Limes, PA-C 01/09/22 2203    Drenda Freeze, MD 01/11/22 1500

## 2022-01-09 NOTE — ED Notes (Signed)
Pt states he has not felt good for about 2 weeks  Pt is c/o general fatigue, body aches and congestion  Pt states throat sore from sinus drainage  Pt states he has felt worse the past 2 days

## 2022-01-09 NOTE — ED Notes (Signed)
Rx x 3 given  Written and verbal inst to pt  Verbalized an understanding  To home  

## 2022-01-09 NOTE — ED Triage Notes (Signed)
Pt reports body aches since Monday; fatigue started today; sore throat

## 2022-01-09 NOTE — Discharge Instructions (Addendum)
Please take antibiotics as prescribed.  Please do not use Afrin longer than 3 days like we discussed.  You can use fluticasone daily.  2 sprays each nostril.  Please return to the emergency department for worsening symptoms.  I would like for you to call [facility] for primary care follow-up.

## 2022-04-23 ENCOUNTER — Other Ambulatory Visit: Payer: Self-pay

## 2022-04-23 ENCOUNTER — Emergency Department (HOSPITAL_BASED_OUTPATIENT_CLINIC_OR_DEPARTMENT_OTHER)
Admission: EM | Admit: 2022-04-23 | Discharge: 2022-04-23 | Disposition: A | Discharge status: Home / Self Care | Payer: Self-pay | Attending: Emergency Medicine | Admitting: Emergency Medicine

## 2022-04-23 ENCOUNTER — Encounter (HOSPITAL_BASED_OUTPATIENT_CLINIC_OR_DEPARTMENT_OTHER): Payer: Self-pay | Admitting: Emergency Medicine

## 2022-04-23 DIAGNOSIS — Z7982 Long term (current) use of aspirin: Secondary | ICD-10-CM | POA: Insufficient documentation

## 2022-04-23 DIAGNOSIS — L03116 Cellulitis of left lower limb: Secondary | ICD-10-CM | POA: Insufficient documentation

## 2022-04-23 MED ORDER — SULFAMETHOXAZOLE-TRIMETHOPRIM 800-160 MG PO TABS: 1.0000 | ORAL_TABLET | Freq: Two times a day (BID) | ORAL | 0 refills | Status: AC

## 2022-04-23 NOTE — ED Triage Notes (Signed)
Pt states he got bit by two ants on his left foot yesterday  Pt is c/o swelling and pain today  Pt has redness noted to his foot

## 2022-04-23 NOTE — Discharge Instructions (Addendum)
Your exam is consistent with cellulitis.  I have sent antibiotics into the pharmacy for you.  If you have significant worsening of the redness or swelling in the next 2 days or you develop fever please return for evaluation.  If it continues to worsen after the 2 days despite taking antibiotics return for evaluation.  I will also attach information for [facility] for you to establish primary care with.

## 2022-04-23 NOTE — ED Provider Notes (Signed)
[facility] EMERGENCY DEPARTMENT Provider Note   CSN: [id number] Arrival date & time: 04/23/22  2010     History  Chief Complaint  Patient presents with   Foot Pain    Herbert Barnes is a 46 y.o. male.  46 year old male presents today for evaluation of left lower extremity pain and swelling following ant bites that occurred yesterday.  He was moving his grill when he struck the ant mound.  Denies other complaints.  He is without fever, chills.  No drainage from this area.  He has some discoloration to his left forefoot however he states that his chronic.  The history is provided by the patient. No language interpreter was used.      Home Medications Prior to Admission medications   Medication Sig Start Date End Date Taking? Authorizing Provider  amoxicillin-clavulanate (AUGMENTIN) 875-125 MG tablet Take 1 tablet by mouth every 12 (twelve) hours. 01/09/22   Honor Loh M, PA-C  aspirin-acetaminophen-caffeine (EXCEDRIN MIGRAINE) [phone] MG tablet Take 2 tablets by mouth every 6 (six) hours as needed for headache.    [provider]  benzonatate (TESSALON) 100 MG capsule Take 1 capsule (100 mg total) by mouth 3 (three) times daily as needed for cough. Patient not taking: No sig reported 09/04/18   Michela Pitcher A, PA-C  clotrimazole (LOTRIMIN) 1 % cream Apply to affected area 2 times daily 10/17/20   Farrel Gordon, PA-C  fluticasone [facility]) 50 MCG/ACT nasal spray Place 2 sprays into both nostrils daily. 01/09/22   Honor Loh M, PA-C  methocarbamol (ROBAXIN) 500 MG tablet Take 1 tablet (500 mg total) by mouth every 8 (eight) hours as needed for muscle spasms. Patient not taking: No sig reported 12/11/18   Petrucelli, Samantha R, PA-C  omeprazole (PRILOSEC) 20 MG capsule Take 1 capsule (20 mg total) by mouth daily. 12/31/18   Sabas Sous, MD  oxymetazoline (AFRIN NASAL SPRAY) 0.05 % nasal spray Place 1 spray into both nostrils 2 (two) times daily. 01/09/22    Honor Loh M, PA-C  sucralfate (CARAFATE) 1 g tablet Take 1 tablet (1 g total) by mouth 4 (four) times daily as needed. 12/31/18   Sabas Sous, MD      Allergies    Codeine    Review of Systems   Review of Systems  Constitutional:  Negative for chills and fever.  Skin:  Positive for wound (3 small puncture wounds to the left forefoot at the base of the toes.).  All other systems reviewed and are negative.  Physical Exam Updated Vital Signs BP (!) 158/89 (BP Location: Left Arm)   Pulse 73   Temp 98.8 F (37.1 C) (Oral)   Resp 18   Ht 6\' 1"  (1.854 m)   Wt 102.1 kg   SpO2 100%   BMI 29.69 kg/m  Physical Exam Vitals and nursing note reviewed.  Constitutional:      General: He is not in acute distress.    Appearance: Normal appearance. He is not ill-appearing.  HENT:     Head: Normocephalic and atraumatic.     Nose: Nose normal.  Eyes:     Conjunctiva/sclera: Conjunctivae normal.  Pulmonary:     Effort: Pulmonary effort is normal. No respiratory distress.  Musculoskeletal:        General: No deformity.     Comments: Left foot with swelling and erythema.  3 puncture wounds noted.  2+ DP pulse present.  Discoloration noted however this is chronic.  Full range  of motion and ability to bear weight intact.  Skin:    Findings: No rash.  Neurological:     Mental Status: He is alert.    ED Results / Procedures / Treatments   Labs (all labs ordered are listed, but only abnormal results are displayed) Labs Reviewed - No data to display  EKG None  Radiology No results found.  Procedures Procedures    Medications Ordered in ED Medications - No data to display  ED Course/ Medical Decision Making/ A&P                           Medical Decision Making  Patient presents today for evaluation of ant bites.  Exam consistent with cellulitis.  Bactrim prescribed.  Extensive discussion had regarding return precautions.  Patient voices understanding and is in  agreement with plan.   Final Clinical Impression(s) / ED Diagnoses Final diagnoses:  Cellulitis of left foot    Rx / DC Orders ED Discharge Orders          Ordered    sulfamethoxazole-trimethoprim (BACTRIM DS) 800-160 MG tablet  2 times daily        04/23/22 2154              Marita Kansas, PA-C 04/23/22 2154    Tegeler, Canary Brim, MD 04/24/22 [phone]

## 2022-08-21 ENCOUNTER — Emergency Department (HOSPITAL_BASED_OUTPATIENT_CLINIC_OR_DEPARTMENT_OTHER): Payer: Self-pay

## 2022-08-21 ENCOUNTER — Other Ambulatory Visit: Payer: Self-pay

## 2022-08-21 ENCOUNTER — Emergency Department (HOSPITAL_BASED_OUTPATIENT_CLINIC_OR_DEPARTMENT_OTHER)
Admission: EM | Admit: 2022-08-21 | Discharge: 2022-08-21 | Disposition: A | Discharge status: Home / Self Care | Payer: Self-pay | Attending: Student | Admitting: Student

## 2022-08-21 ENCOUNTER — Encounter (HOSPITAL_BASED_OUTPATIENT_CLINIC_OR_DEPARTMENT_OTHER): Payer: Self-pay | Admitting: Emergency Medicine

## 2022-08-21 DIAGNOSIS — M25511 Pain in right shoulder: Secondary | ICD-10-CM

## 2022-08-21 DIAGNOSIS — M79601 Pain in right arm: Secondary | ICD-10-CM | POA: Insufficient documentation

## 2022-08-21 MED ORDER — KETOROLAC TROMETHAMINE 15 MG/ML IJ SOLN
15.0000 mg | Freq: Once | INTRAMUSCULAR | Status: AC
  Administered 2022-08-21: 15 mg via INTRAMUSCULAR
  Filled 2022-08-21: qty 1

## 2022-08-21 MED ORDER — KETOROLAC TROMETHAMINE 30 MG/ML IJ SOLN
15.0000 mg | Freq: Once | INTRAMUSCULAR | Status: DC
  Filled 2022-08-21: qty 1

## 2022-08-21 MED ORDER — NAPROXEN 500 MG PO TABS: 500.0000 mg | ORAL_TABLET | Freq: Two times a day (BID) | ORAL | 0 refills | Status: DC

## 2022-08-21 MED ORDER — DEXAMETHASONE SODIUM PHOSPHATE 10 MG/ML IJ SOLN
10.0000 mg | Freq: Once | INTRAMUSCULAR | Status: AC
  Administered 2022-08-21: 10 mg via INTRAMUSCULAR
  Filled 2022-08-21: qty 1

## 2022-08-21 NOTE — ED Triage Notes (Signed)
Rt arm since moving gates at work  on Saturday and he cannot raise it  now , hurts to move it , ,  deneis and obvious injury  hand has felt like it swelling , has good pulse and neuro < 3 sec cap refill, can feel me touch

## 2022-08-21 NOTE — Discharge Instructions (Addendum)
You were seen in the emergency department today for shoulder pain. You likely have a musculoskeletal injury, possibly to your rotator cuff. We have given you a sling here to use for comfort. Please do not use this 24/7 as you have diabetes as this can cause adhesive capsulitis or frozen shoulder if used extensively. I have also given you a prescription for Naproxen which you can use for pain. Please ice the area. I have sent a referral to orthopedics. If you do not receive a call in 72 business hours, I have provided you with their number to call and schedule an appointment for ongoing management of your symptoms.

## 2022-08-21 NOTE — ED Provider Notes (Signed)
[facility] EMERGENCY DEPARTMENT Provider Note   CSN: [id number] Arrival date & time: 08/21/22  3149     History  Chief Complaint  Patient presents with   Arm Pain    Herbert Barnes is a 46 y.o. male. With past medical history of diabetes who presents to the emergency department with arm pain.  States yesterday he was at work when he began having right-sided shoulder pain.  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  States that around 10 AM he noticed pain to the right shoulder that he states has progressively worsened to today.  States that he does remember pulling some PVC piping and feeling a possible tinge of pain but no popping or obvious trauma.  Since then he has had progressive worsening and has very limited range of motion as well as some tingling to the right hand and feels that his right hand has become more swollen.  He denies previous surgeries to the shoulder.  Denies numbness.  Denies chest pain.   Arm Pain       Home Medications Prior to Admission medications   Medication Sig Start Date End Date Taking? Authorizing Provider  naproxen (NAPROSYN) 500 MG tablet Take 1 tablet (500 mg total) by mouth 2 (two) times daily. 08/21/22  Yes Cristopher Peru, PA-C  amoxicillin-clavulanate (AUGMENTIN) 875-125 MG tablet Take 1 tablet by mouth every 12 (twelve) hours. 01/09/22   Honor Loh M, PA-C  aspirin-acetaminophen-caffeine (EXCEDRIN MIGRAINE) [phone] MG tablet Take 2 tablets by mouth every 6 (six) hours as needed for headache.    [provider]  benzonatate (TESSALON) 100 MG capsule Take 1 capsule (100 mg total) by mouth 3 (three) times daily as needed for cough. Patient not taking: No sig reported 09/04/18   Michela Pitcher A, PA-C  clotrimazole (LOTRIMIN) 1 % cream Apply to affected area 2 times daily 10/17/20   Farrel Gordon, PA-C  fluticasone [facility]) 50 MCG/ACT nasal spray Place 2 sprays into both nostrils daily. 01/09/22    Honor Loh M, PA-C  methocarbamol (ROBAXIN) 500 MG tablet Take 1 tablet (500 mg total) by mouth every 8 (eight) hours as needed for muscle spasms. Patient not taking: No sig reported 12/11/18   Petrucelli, Samantha R, PA-C  omeprazole (PRILOSEC) 20 MG capsule Take 1 capsule (20 mg total) by mouth daily. 12/31/18   Sabas Sous, MD  oxymetazoline (AFRIN NASAL SPRAY) 0.05 % nasal spray Place 1 spray into both nostrils 2 (two) times daily. 01/09/22   Honor Loh M, PA-C  sucralfate (CARAFATE) 1 g tablet Take 1 tablet (1 g total) by mouth 4 (four) times daily as needed. 12/31/18   Sabas Sous, MD      Allergies    Codeine    Review of Systems   Review of Systems  Musculoskeletal:  Positive for arthralgias and myalgias. Negative for joint swelling.  All other systems reviewed and are negative.   Physical Exam Updated Vital Signs BP (!) 154/95 (BP Location: Right Arm)   Pulse 82   Temp 97.7 F (36.5 C) (Oral)   Resp 16   Ht 6\' 1"  (1.854 m)   Wt 100.7 kg   SpO2 100%   BMI 29.29 kg/m  Physical Exam Vitals and nursing note reviewed.  Constitutional:      General: He is not in acute distress.    Appearance: Normal appearance. He is not ill-appearing or toxic-appearing.  HENT:     Head: Normocephalic and  atraumatic.  Eyes:     General: No scleral icterus. Cardiovascular:     Pulses: Normal pulses.  Pulmonary:     Effort: Pulmonary effort is normal. No respiratory distress.  Musculoskeletal:        General: Tenderness and signs of injury present. No swelling or deformity.     Right shoulder: Tenderness present. No swelling, deformity or effusion. Decreased range of motion. Decreased strength. Normal pulse.     Left shoulder: Normal.       Arms:     Comments: Right shoulder with very limited active ROM to flexion, abduction. Increased passive ROM. No obvious swelling, erythema, deformity. Radial pulse 2+. Compartments soft. Normal ROM and no pain to the elbow or wrist.   Skin:    General: Skin is warm and dry.     Capillary Refill: Capillary refill takes less than 2 seconds.     Findings: No bruising, erythema or rash.  Neurological:     General: No focal deficit present.     Mental Status: He is alert and oriented to person, place, and time. Mental status is at baseline.  Psychiatric:        Mood and Affect: Mood normal.        Behavior: Behavior normal.        Thought Content: Thought content normal.        Judgment: Judgment normal.     ED Results / Procedures / Treatments   Labs (all labs ordered are listed, but only abnormal results are displayed) Labs Reviewed - No data to display  EKG None  Radiology DG Shoulder Right  Result Date: 08/21/2022 CLINICAL DATA:  Pain EXAM: RIGHT SHOULDER - 3 VIEW COMPARISON:  None Available. FINDINGS: There is no evidence of fracture or dislocation. There is no evidence of arthropathy or other focal bone abnormality. Soft tissues are unremarkable. IMPRESSION: Negative. Electronically Signed   By: Allegra Lai M.D.   On: 08/21/2022 10:06    Procedures Procedures   Medications Ordered in ED Medications  dexamethasone (DECADRON) injection 10 mg (10 mg Intramuscular Given 08/21/22 1020)  ketorolac (TORADOL) 15 MG/ML injection 15 mg (15 mg Intramuscular Given 08/21/22 1021)   ED Course/ Medical Decision Making/ A&P                           Medical Decision Making Amount and/or Complexity of Data Reviewed Radiology: ordered.  Risk Prescription drug management.  This patient presents to the ED with chief complaint(s) of arm pain with pertinent past medical history of diabetes which further complicates the presenting complaint. The complaint involves an extensive differential diagnosis and also carries with it a high risk of complications and morbidity.    The differential diagnosis includes fracture, subluxation, ligamentous or tendinous injury, frozen shoulder, arthritis, septic joint, gouty  arthritis, gonococcal arthropathy, DVT, etc.    Additional history obtained: Additional history obtained from spouse Records reviewed Care Everywhere/External Records  ED Course and Reassessment: 46 year old male who presents to the emergency department with right shoulder pain.  Physical exam with tenderness to palpation of the deltoid, trapezius, teres minor. He has limited active ROM which improves with passive ROM.  There is no erythema, warmth concerning for septic joint. Doubt gout or gonococcal arthropathy. He has no obvious swelling to the arm concerning for DVT. Plain films show no fracture or subluxation. No chest pain or symptoms concerning for other etiology such as ACS. Suspect tendinous or ligamentous injury.  Given 15mg  IM toradol and 10mg  IM decadron. Also provided with ice and sling.   Instructed him not to wear sling 24/7 given history of diabetes and want to avoid adhesive capsulitis. Give prescription for naproxen. Instructed to continue ice, gentle ROM. Will provide referral to orthopedics if he has no improvement in symptoms over the next week. He is in agreement with this plan.   Independent labs interpretation:  The following labs were independently interpreted: not indicated   Independent visualization of imaging: - I independently visualized the following imaging with scope of interpretation limited to determining acute life threatening conditions related to emergency care: plain film of the right shoulder, which revealed no abnormalities  Consultation: - Consulted or discussed management/test interpretation w/ external professional: not indicated  Consideration for admission or further workup: not indicated  Social Determinants of health: none identified  Final Clinical Impression(s) / ED Diagnoses Final diagnoses:  Acute pain of right shoulder    Rx / DC Orders ED Discharge Orders          Ordered    AMB referral to orthopedics        08/21/22 1021     naproxen (NAPROSYN) 500 MG tablet  2 times daily        08/21/22 1042              Mickie Hillier, PA-C 08/21/22 [place]    Kommor, Debe Coder, MD 08/21/22 2107

## 2023-05-01 ENCOUNTER — Other Ambulatory Visit: Payer: Self-pay

## 2023-05-01 ENCOUNTER — Emergency Department (HOSPITAL_BASED_OUTPATIENT_CLINIC_OR_DEPARTMENT_OTHER)
Admission: EM | Admit: 2023-05-01 | Discharge: 2023-05-01 | Disposition: A | Discharge status: Home / Self Care | Payer: Self-pay | Attending: Emergency Medicine | Admitting: Emergency Medicine

## 2023-05-01 ENCOUNTER — Encounter (HOSPITAL_BASED_OUTPATIENT_CLINIC_OR_DEPARTMENT_OTHER): Payer: Self-pay | Admitting: Emergency Medicine

## 2023-05-01 DIAGNOSIS — Z20822 Contact with and (suspected) exposure to covid-19: Secondary | ICD-10-CM | POA: Insufficient documentation

## 2023-05-01 DIAGNOSIS — J019 Acute sinusitis, unspecified: Secondary | ICD-10-CM | POA: Insufficient documentation

## 2023-05-01 DIAGNOSIS — J029 Acute pharyngitis, unspecified: Secondary | ICD-10-CM | POA: Insufficient documentation

## 2023-05-01 LAB — SARS CORONAVIRUS 2 BY RT PCR: SARS Coronavirus 2 by RT PCR: NEGATIVE

## 2023-05-01 LAB — GROUP A STREP BY PCR: Group A Strep by PCR: NOT DETECTED

## 2023-05-01 MED ORDER — AMOXICILLIN-POT CLAVULANATE 875-125 MG PO TABS: 1.0000 | ORAL_TABLET | Freq: Two times a day (BID) | ORAL | 0 refills | Status: DC

## 2023-05-01 NOTE — Discharge Instructions (Addendum)
Return if any problems.

## 2023-05-01 NOTE — ED Triage Notes (Signed)
Patient arrived via POV c/o sore throat and sinus pain x 10 days. Patient states sinus drainage and it's painful to swallow. Patient is AO x 4, VS WDL, normal gait.

## 2023-05-01 NOTE — ED Provider Notes (Signed)
[place] EMERGENCY DEPARTMENT AT [facility] Provider Note   CSN: [id number] Arrival date & time: 05/01/23  1918     History  Chief Complaint  Patient presents with   Sore Throat   Facial Pain    Herbert Barnes is a 47 y.o. male.  Pt complains of a sore throat for the past week.  Pt reports facial discomfort and feeling of swelling to his face for 10 days   The history is provided by the patient. No language interpreter was used.  Sore Throat This is a new problem. The problem occurs constantly. The problem has not changed since onset.Nothing aggravates the symptoms. Nothing relieves the symptoms. He has tried nothing for the symptoms.       Home Medications Prior to Admission medications   Medication Sig Start Date End Date Taking? Authorizing Provider  amoxicillin-clavulanate (AUGMENTIN) 875-125 MG tablet Take 1 tablet by mouth every 12 (twelve) hours. 05/01/23   Elson Areas, PA-C  aspirin-acetaminophen-caffeine (EXCEDRIN MIGRAINE) [phone] MG tablet Take 2 tablets by mouth every 6 (six) hours as needed for headache.    [provider]  benzonatate (TESSALON) 100 MG capsule Take 1 capsule (100 mg total) by mouth 3 (three) times daily as needed for cough. Patient not taking: No sig reported 09/04/18   Michela Pitcher A, PA-C  clotrimazole (LOTRIMIN) 1 % cream Apply to affected area 2 times daily 10/17/20   Farrel Gordon, PA-C  fluticasone [facility]) 50 MCG/ACT nasal spray Place 2 sprays into both nostrils daily. 01/09/22   Honor Loh M, PA-C  methocarbamol (ROBAXIN) 500 MG tablet Take 1 tablet (500 mg total) by mouth every 8 (eight) hours as needed for muscle spasms. Patient not taking: No sig reported 12/11/18   Petrucelli, Samantha R, PA-C  naproxen (NAPROSYN) 500 MG tablet Take 1 tablet (500 mg total) by mouth 2 (two) times daily. 08/21/22   Cristopher Peru, PA-C  omeprazole (PRILOSEC) 20 MG capsule Take 1 capsule (20 mg total) by mouth daily. 12/31/18    Sabas Sous, MD  oxymetazoline (AFRIN NASAL SPRAY) 0.05 % nasal spray Place 1 spray into both nostrils 2 (two) times daily. 01/09/22   Honor Loh M, PA-C  sucralfate (CARAFATE) 1 g tablet Take 1 tablet (1 g total) by mouth 4 (four) times daily as needed. 12/31/18   Sabas Sous, MD      Allergies    Codeine    Review of Systems   Review of Systems  All other systems reviewed and are negative.   Physical Exam Updated Vital Signs BP (!) 155/78 (BP Location: Right Arm)   Pulse 88   Temp 98.6 F (37 C) (Oral)   Resp 18   Ht 6\' 1"  (1.854 m)   Wt 102.1 kg   SpO2 99%   BMI 29.69 kg/m  Physical Exam Vitals and nursing note reviewed.  Constitutional:      Appearance: He is well-developed.  HENT:     Head: Normocephalic.     Right Ear: Tympanic membrane normal.     Left Ear: Tympanic membrane normal.     Mouth/Throat:     Mouth: Mucous membranes are moist.     Pharynx: Pharyngeal swelling present.     Tonsils: 1+ on the right. 1+ on the left.  Cardiovascular:     Rate and Rhythm: Normal rate.  Pulmonary:     Effort: Pulmonary effort is normal.  Abdominal:     General: There is no  distension.  Musculoskeletal:        General: Normal range of motion.     Cervical back: Normal range of motion.  Skin:    General: Skin is warm.  Neurological:     General: No focal deficit present.     Mental Status: He is alert and oriented to person, place, and time.  Psychiatric:        Mood and Affect: Mood normal.     ED Results / Procedures / Treatments   Labs (all labs ordered are listed, but only abnormal results are displayed) Labs Reviewed  SARS CORONAVIRUS 2 BY RT PCR  GROUP A STREP BY PCR    EKG None  Radiology No results found.  Procedures Procedures    Medications Ordered in ED Medications - No data to display  ED Course/ Medical Decision Making/ A&P                             Medical Decision Making Pt complains of sinus pressure and a sore  throat   Risk Prescription drug management.           Final Clinical Impression(s) / ED Diagnoses Final diagnoses:  Pharyngitis, unspecified etiology  Acute sinusitis, recurrence not specified, unspecified location    Rx / DC Orders ED Discharge Orders          Ordered    amoxicillin-clavulanate (AUGMENTIN) 875-125 MG tablet  Every 12 hours        05/01/23 2021           An After Visit Summary was printed and given to the patient.    Elson Areas, Cordelia Poche 05/01/23 2141    Terald Sleeper, MD 05/02/23 1022

## 2023-07-01 ENCOUNTER — Other Ambulatory Visit: Payer: Self-pay

## 2023-07-01 ENCOUNTER — Emergency Department (HOSPITAL_BASED_OUTPATIENT_CLINIC_OR_DEPARTMENT_OTHER)
Admission: EM | Admit: 2023-07-01 | Discharge: 2023-07-01 | Disposition: A | Discharge status: Home / Self Care | Payer: Self-pay | Attending: Emergency Medicine | Admitting: Emergency Medicine

## 2023-07-01 ENCOUNTER — Encounter (HOSPITAL_BASED_OUTPATIENT_CLINIC_OR_DEPARTMENT_OTHER): Payer: Self-pay | Admitting: *Deleted

## 2023-07-01 DIAGNOSIS — L089 Local infection of the skin and subcutaneous tissue, unspecified: Secondary | ICD-10-CM

## 2023-07-01 DIAGNOSIS — L02611 Cutaneous abscess of right foot: Secondary | ICD-10-CM | POA: Insufficient documentation

## 2023-07-01 DIAGNOSIS — Z23 Encounter for immunization: Secondary | ICD-10-CM | POA: Insufficient documentation

## 2023-07-01 DIAGNOSIS — T63421A Toxic effect of venom of ants, accidental (unintentional), initial encounter: Secondary | ICD-10-CM | POA: Insufficient documentation

## 2023-07-01 DIAGNOSIS — E119 Type 2 diabetes mellitus without complications: Secondary | ICD-10-CM | POA: Insufficient documentation

## 2023-07-01 DIAGNOSIS — L03115 Cellulitis of right lower limb: Secondary | ICD-10-CM | POA: Insufficient documentation

## 2023-07-01 MED ORDER — TETANUS-DIPHTH-ACELL PERTUSSIS 5-2.5-18.5 LF-MCG/0.5 IM SUSY
0.5000 mL | PREFILLED_SYRINGE | Freq: Once | INTRAMUSCULAR | Status: AC
  Administered 2023-07-01: 0.5 mL via INTRAMUSCULAR
  Filled 2023-07-01: qty 0.5

## 2023-07-01 MED ORDER — PREDNISONE 20 MG PO TABS: ORAL_TABLET | ORAL | 0 refills | Status: AC

## 2023-07-01 MED ORDER — PREDNISONE 50 MG PO TABS
60.0000 mg | ORAL_TABLET | Freq: Once | ORAL | Status: AC
  Administered 2023-07-01: 60 mg via ORAL
  Filled 2023-07-01: qty 1

## 2023-07-01 MED ORDER — DOXYCYCLINE HYCLATE 100 MG PO CAPS: 100.0000 mg | ORAL_CAPSULE | Freq: Two times a day (BID) | ORAL | 0 refills | Status: DC

## 2023-07-01 MED ORDER — OXYCODONE-ACETAMINOPHEN 5-325 MG PO TABS
1.0000 | ORAL_TABLET | Freq: Once | ORAL | Status: AC
  Administered 2023-07-01: 1 via ORAL
  Filled 2023-07-01: qty 1

## 2023-07-01 MED ORDER — DIPHENHYDRAMINE HCL 25 MG PO TABS: 25.0000 mg | ORAL_TABLET | Freq: Four times a day (QID) | ORAL | 0 refills | Status: DC | PRN

## 2023-07-01 NOTE — Discharge Instructions (Addendum)
Believe that you have an infection in your foot secondary to the antibiotics.  We have placed you on doxycycline, please take the antibiotic and follow-up.  Return to the ER if the redness, swelling does not improve.  I also placed you on some prednisone, this will help with swelling of the area, but may raise your sugars.  I recommend that you follow-up with your primary care doctor in 3 days, for further evaluation.  You should see symptomatic improvement in the next 24 to 48 hours.  Keep the area clean and dry, as I drained the pustules.

## 2023-07-01 NOTE — ED Provider Notes (Signed)
[place] EMERGENCY DEPARTMENT AT [facility] Provider Note   CSN: [id number] Arrival date & time: 07/01/23  1201     History  Chief Complaint  Patient presents with   Insect Bite    Herbert Barnes is a 47 y.o. male, diabetes, who presents to the ED secondary to bites on his right foot, that occurred while he was outside.  He states he stepped in a pile of fire ants, and his foot swelled up noticeably, is swollen and tender.  He notes that he is also developed some pustules on his foot.  Denies any history of IV drug use, denies any trauma to the foot.  Denies any shortness of breath, chest pain, or hives.  States he has had this reaction before with fire ants, but it is worse this time.  Has been taking Benadryl, but has persistent swelling.    He is currently not taking anything for diabetes as he was told while in jail it was well controlled and he did not need metformin.  Home Medications Prior to Admission medications   Medication Sig Start Date End Date Taking? Authorizing Provider  diphenhydrAMINE (BENADRYL) 25 MG tablet Take 1 tablet (25 mg total) by mouth every 6 (six) hours as needed. 07/01/23  Yes Mindie Rawdon L, PA  doxycycline (VIBRAMYCIN) 100 MG capsule Take 1 capsule (100 mg total) by mouth 2 (two) times daily. 07/01/23  Yes Silvia Hightower L, PA  predniSONE (DELTASONE) 20 MG tablet Take 3 tablets (60 mg total) by mouth daily for 2 days, THEN 2 tablets (40 mg total) daily for 2 days, THEN 1 tablet (20 mg total) daily for 2 days. 07/01/23 07/07/23 Yes Jonnie Kubly L, PA  amoxicillin-clavulanate (AUGMENTIN) 875-125 MG tablet Take 1 tablet by mouth every 12 (twelve) hours. 05/01/23   Elson Areas, PA-C  aspirin-acetaminophen-caffeine (EXCEDRIN MIGRAINE) [phone] MG tablet Take 2 tablets by mouth every 6 (six) hours as needed for headache.    [provider]  benzonatate (TESSALON) 100 MG capsule Take 1 capsule (100 mg total) by mouth 3 (three) times daily as  needed for cough. Patient not taking: No sig reported 09/04/18   Michela Pitcher A, PA-C  clotrimazole (LOTRIMIN) 1 % cream Apply to affected area 2 times daily 10/17/20   Farrel Gordon, PA-C  fluticasone [facility]) 50 MCG/ACT nasal spray Place 2 sprays into both nostrils daily. 01/09/22   Honor Loh M, PA-C  methocarbamol (ROBAXIN) 500 MG tablet Take 1 tablet (500 mg total) by mouth every 8 (eight) hours as needed for muscle spasms. Patient not taking: No sig reported 12/11/18   Petrucelli, Samantha R, PA-C  naproxen (NAPROSYN) 500 MG tablet Take 1 tablet (500 mg total) by mouth 2 (two) times daily. 08/21/22   Cristopher Peru, PA-C  omeprazole (PRILOSEC) 20 MG capsule Take 1 capsule (20 mg total) by mouth daily. 12/31/18   Sabas Sous, MD  oxymetazoline (AFRIN NASAL SPRAY) 0.05 % nasal spray Place 1 spray into both nostrils 2 (two) times daily. 01/09/22   Honor Loh M, PA-C  sucralfate (CARAFATE) 1 g tablet Take 1 tablet (1 g total) by mouth 4 (four) times daily as needed. 12/31/18   Sabas Sous, MD      Allergies    Codeine    Review of Systems   Review of Systems  Constitutional:  Negative for fever.  Skin:  Positive for rash.    Physical Exam Updated Vital Signs BP 131/76 (BP Location: Right  Arm)   Temp 98.9 F (37.2 C) (Oral)   Resp 18   Ht 6\' 1"  (1.854 m)   Wt 102.1 kg   SpO2 100%   BMI 29.69 kg/m  Physical Exam Vitals and nursing note reviewed.  Constitutional:      General: He is not in acute distress.    Appearance: He is well-developed.  HENT:     Head: Normocephalic and atraumatic.  Eyes:     Conjunctiva/sclera: Conjunctivae normal.  Cardiovascular:     Rate and Rhythm: Normal rate and regular rhythm.     Heart sounds: No murmur heard. Pulmonary:     Effort: Pulmonary effort is normal. No respiratory distress.     Breath sounds: Normal breath sounds.  Abdominal:     Palpations: Abdomen is soft.     Tenderness: There is no abdominal tenderness.   Musculoskeletal:        General: No swelling.     Cervical back: Neck supple.  Skin:    General: Skin is warm and dry.     Capillary Refill: Capillary refill takes less than 2 seconds.     Comments: Edematous, erythematous, right foot, with positive dorsalis pedis pulse.  Good capillary refill.  With 6 pustules, on the dorsal aspect of the foot, as well as the ankle.  Neurological:     Mental Status: He is alert.  Psychiatric:        Mood and Affect: Mood normal.     ED Results / Procedures / Treatments   Labs (all labs ordered are listed, but only abnormal results are displayed) Labs Reviewed - No data to display  EKG None  Radiology No results found.  Procedures .Marland KitchenIncision and Drainage  Date/Time: 07/01/2023 12:46 PM  Performed by: Pete Pelt, PA Authorized by: Pete Pelt, PA   Consent:    Consent obtained:  Verbal   Consent given by:  Patient   Risks, benefits, and alternatives were discussed: yes     Risks discussed:  Bleeding, incomplete drainage, infection, damage to other organs and pain   Alternatives discussed:  Alternative treatment Universal protocol:    Patient identity confirmed:  Verbally with patient Location:    Indications for incision and drainage: pustules.   Size:  2mm (6 pustules total)   Location:  Lower extremity   Lower extremity location:  Foot   Foot location:  R foot Pre-procedure details:    Skin preparation:  Chlorhexidine Sedation:    Sedation type:  None Anesthesia:    Anesthesia method:  None Procedure type:    Complexity:  Simple Procedure details:    Incision type: 22 gauge needle stab.   Drainage:  Purulent   Drainage amount:  Scant   Wound treatment: bandaid placed.   Packing materials:  None Post-procedure details:    Procedure completion:  Tolerated Comments:     Use individual needles, or pustule drainage, with chlorhexidine, and Band-Aid applied.  Tolerated well.     Medications Ordered in  ED Medications  Tdap (BOOSTRIX) injection 0.5 mL (has no administration in time range)  oxyCODONE-acetaminophen (PERCOCET/ROXICET) 5-325 MG per tablet 1 tablet (1 tablet Oral Given 07/01/23 1240)  predniSONE (DELTASONE) tablet 60 mg (60 mg Oral Given 07/01/23 1240)    ED Course/ Medical Decision Making/ A&P                                 Medical Decision  Making Discussed with the patient, he denies any trauma, declines x-ray, here for right lower extremity swelling, of the foot after ant bites.  He has unknown last tetanus thus we will update given the bite.  As well as start him on antibiotics, as his foot is erythematous, and edematous.  With pustules.  I drained the pustules with a 22-gauge needle, which was replaced individually for each pustule, and prepped with chlorhexidine.  There was drainage of these, to help prevent further development of abscess.  We discussed return precautions and he voiced understanding.  Discharged on prednisone, for swelling, as well as Benadryl, and doxycycline.  We discussed follow-up with primary care doctor, and to utilize the number listed below, if no primary care doctor.  Risk OTC drugs. Prescription drug management.    Final Clinical Impression(s) / ED Diagnoses Final diagnoses:  Fire ant bite, accidental or unintentional, initial encounter  Skin pustule  Cellulitis of right lower extremity    Rx / DC Orders ED Discharge Orders          Ordered    doxycycline (VIBRAMYCIN) 100 MG capsule  2 times daily        07/01/23 1241    diphenhydrAMINE (BENADRYL) 25 MG tablet  Every 6 hours PRN        07/01/23 1241    predniSONE (DELTASONE) 20 MG tablet  Daily        07/01/23 1241              Roselani Grajeda, [place], PA 07/01/23 1250    Terrilee Files, MD 07/01/23 1731

## 2023-07-01 NOTE — ED Triage Notes (Addendum)
BIB family from home s/p fire ant bites to R foot with associated redness, swelling, pain and itching, reports "doesn't react well to ant bites". Denies sysemic symptoms. Occurred Sunday, 2d ago. Took benadryl last night. Alert, NAD, calm, interactive. H/o DM.

## 2023-07-01 NOTE — ED Notes (Signed)

## 2023-10-25 ENCOUNTER — Encounter (HOSPITAL_BASED_OUTPATIENT_CLINIC_OR_DEPARTMENT_OTHER): Payer: Self-pay | Admitting: Emergency Medicine

## 2023-10-25 ENCOUNTER — Emergency Department (HOSPITAL_BASED_OUTPATIENT_CLINIC_OR_DEPARTMENT_OTHER)
Admission: EM | Admit: 2023-10-25 | Discharge: 2023-10-26 | Disposition: A | Discharge status: Home / Self Care | Payer: BLUE CROSS/BLUE SHIELD | Attending: Emergency Medicine | Admitting: Emergency Medicine

## 2023-10-25 ENCOUNTER — Other Ambulatory Visit: Payer: Self-pay

## 2023-10-25 DIAGNOSIS — H6690 Otitis media, unspecified, unspecified ear: Secondary | ICD-10-CM | POA: Insufficient documentation

## 2023-10-25 DIAGNOSIS — E119 Type 2 diabetes mellitus without complications: Secondary | ICD-10-CM | POA: Insufficient documentation

## 2023-10-25 DIAGNOSIS — H9201 Otalgia, right ear: Secondary | ICD-10-CM | POA: Diagnosis present

## 2023-10-25 DIAGNOSIS — F17210 Nicotine dependence, cigarettes, uncomplicated: Secondary | ICD-10-CM | POA: Insufficient documentation

## 2023-10-25 MED ORDER — HYDROCODONE-ACETAMINOPHEN 5-325 MG PO TABS
1.0000 | ORAL_TABLET | Freq: Once | ORAL | Status: AC
  Administered 2023-10-26: 1 via ORAL
  Filled 2023-10-25: qty 1

## 2023-10-25 MED ORDER — PREDNISONE 50 MG PO TABS
60.0000 mg | ORAL_TABLET | Freq: Once | ORAL | Status: AC
  Administered 2023-10-26: 60 mg via ORAL
  Filled 2023-10-25: qty 1

## 2023-10-25 NOTE — ED Triage Notes (Signed)
Pt states a week or so ago, and was swallowing saliva, right ear popped and felt a cold rush of air, had been draining into throat since with throat pain.

## 2023-10-26 ENCOUNTER — Emergency Department (HOSPITAL_BASED_OUTPATIENT_CLINIC_OR_DEPARTMENT_OTHER): Payer: BLUE CROSS/BLUE SHIELD

## 2023-10-26 MED ORDER — DIPHENHYDRAMINE HCL 25 MG PO CAPS
25.0000 mg | ORAL_CAPSULE | Freq: Once | ORAL | Status: AC
  Administered 2023-10-26: 25 mg via ORAL
  Filled 2023-10-26: qty 1

## 2023-10-26 MED ORDER — AMOXICILLIN 500 MG PO CAPS: 500.0000 mg | ORAL_CAPSULE | Freq: Three times a day (TID) | ORAL | 0 refills | Status: AC

## 2023-10-26 MED ORDER — AMOXICILLIN 500 MG PO CAPS
500.0000 mg | ORAL_CAPSULE | Freq: Once | ORAL | Status: AC
  Administered 2023-10-26: 500 mg via ORAL
  Filled 2023-10-26: qty 1

## 2023-10-26 NOTE — Discharge Instructions (Signed)
You were evaluated in the Emergency Department and after careful evaluation, we did not find any emergent condition requiring admission or further testing in the hospital.  Your exam/testing today is overall reassuring.  Symptoms seem to be due to an ear infection.  Take the amoxicillin antibiotic as directed.  Can use Tylenol or Motrin for discomfort.  Please return to the Emergency Department if you experience any worsening of your condition.   Thank you for allowing [place] to be a part of your care.

## 2023-10-26 NOTE — ED Provider Notes (Signed)
[facility]PT [facility] Emergency Department Provider Note MRN:  [id number]  Arrival date & time: 10/26/23     Chief Complaint   Ear Drainage   History of Present Illness   Herbert Barnes is a 47 y.o. year-old male with a history of diabetes presenting to the ED with chief complaint of ear drainage.  Patient yawned 2 weeks ago and then had sudden popping pain to the right ear.  Persistent pain since then, feels drainage down the back of the throat, starting to have sore throat which is only on the right side of the throat.  Review of Systems  A thorough review of systems was obtained and all systems are negative except as noted in the HPI and PMH.   Patient's Health History    Past Medical History:  Diagnosis Date   Diabetes mellitus without complication (HCC)    Environmental allergies    Headache    hx of migraine in past    Past Surgical History:  Procedure Laterality Date   HAND SURGERY      Family History  Problem Relation Age of Onset   CAD Mother    Diabetes type II Father     Social History   Socioeconomic History   Marital status: Legally Separated    Spouse name: Not on file   Number of children: Not on file   Years of education: Not on file   Highest education level: Not on file  Occupational History   Not on file  Tobacco Use   Smoking status: Every Day    Current packs/day: 0.25    Average packs/day: 0.3 packs/day for 27.0 years (6.8 ttl pk-yrs)    Types: Cigarettes   Smokeless tobacco: Never  Vaping Use   Vaping status: Former  Substance and Sexual Activity   Alcohol use: Yes    Comment: occ   Drug use: No   Sexual activity: Not on file  Other Topics Concern   Not on file  Social History Narrative   Not on file   Social Determinants of Health   Financial Resource Strain: Not on file  Food Insecurity: Not on file  Transportation Needs: Not on file  Physical Activity: Not on file  Stress: Not on file  Social  Connections: Not on file  Intimate Partner Violence: Not on file     Physical Exam   Vitals:   10/25/23 2226 10/25/23 2344  BP: (!) 149/94   Pulse: 92   Resp: 18   Temp: 99.1 F (37.3 C) 98.7 F (37.1 C)  SpO2: 96%     CONSTITUTIONAL: Well-appearing, NAD NEURO/PSYCH:  Alert and oriented x 3, no focal deficits EYES:  eyes equal and reactive ENT/NECK:  no LAD, no JVD CARDIO: Regular rate, well-perfused, normal S1 and S2 PULM:  CTAB no wheezing or rhonchi GI/GU:  non-distended, non-tender MSK/SPINE:  No gross deformities, no edema SKIN:  no rash, atraumatic   *Additional and/or pertinent findings included in MDM below  Diagnostic and Interventional Summary    EKG Interpretation Date/Time:    Ventricular Rate:    PR Interval:    QRS Duration:    QT Interval:    QTC Calculation:   R Axis:      Text Interpretation:         Labs Reviewed - No data to display  CT Soft Tissue Neck Wo Contrast  Final Result      Medications  amoxicillin (AMOXIL) capsule 500 mg (has no  administration in time range)  diphenhydrAMINE (BENADRYL) capsule 25 mg (has no administration in time range)  predniSONE (DELTASONE) tablet 60 mg (60 mg Oral Given 10/26/23 0008)  HYDROcodone-acetaminophen (NORCO/VICODIN) 5-325 MG per tablet 1 tablet (1 tablet Oral Given 10/26/23 0008)     Procedures  /  Critical Care Procedures  ED Course and Medical Decision Making  Initial Impression and Ddx The right TM appears bulging and erythematous, consistent with otitis media.  I do not see any signs of perforation though with a history of drainage there could be a small hole that I am just not seeing.  His oropharynx appears a bit abnormal, seems less full on the right side which is surprising given that his pain is on the right side.  He has tenderness to the right submandibular region.  Will CT to exclude evidence of neoplasm, abscess.  Past medical/surgical history that increases complexity of ED  encounter: None  Interpretation of Diagnostics I personally reviewed the CT neck soft tissue and my interpretation is as follows: No obvious abscess or deeper space infection, no signs of mass or neoplasm.  Radiology commenting on a reactive lymph node but otherwise no emergent process  Patient Reassessment and Ultimate Disposition/Management     Patient continues to look and feel well with normal vital signs, appropriate for discharge with otitis media treatment.  Patient management required discussion with the following services or consulting groups:  None  Complexity of Problems Addressed Acute illness or injury that poses threat of life of bodily function  Additional Data Reviewed and Analyzed Further history obtained from: Further history from spouse/family member  Additional Factors Impacting ED Encounter Risk Prescriptions  Elmer Sow. Pilar Plate, MD [facility] Emergency Medicine [facility] [email or web address]  Final Clinical Impressions(s) / ED Diagnoses     ICD-10-CM   1. Acute otitis media, unspecified otitis media type  H66.90       ED Discharge Orders          Ordered    amoxicillin (AMOXIL) 500 MG capsule  3 times daily        10/26/23 0206             Discharge Instructions Discussed with and Provided to Patient:     Discharge Instructions      You were evaluated in the Emergency Department and after careful evaluation, we did not find any emergent condition requiring admission or further testing in the hospital.  Your exam/testing today is overall reassuring.  Symptoms seem to be due to an ear infection.  Take the amoxicillin antibiotic as directed.  Can use Tylenol or Motrin for discomfort.  Please return to the Emergency Department if you experience any worsening of your condition.   Thank you for allowing [place] to be a part of your care.       Sabas Sous, MD 10/26/23 [phone]

## 2023-12-04 ENCOUNTER — Other Ambulatory Visit: Payer: Self-pay

## 2023-12-04 ENCOUNTER — Emergency Department (HOSPITAL_BASED_OUTPATIENT_CLINIC_OR_DEPARTMENT_OTHER): Payer: BLUE CROSS/BLUE SHIELD

## 2023-12-04 ENCOUNTER — Other Ambulatory Visit (HOSPITAL_BASED_OUTPATIENT_CLINIC_OR_DEPARTMENT_OTHER): Payer: Self-pay

## 2023-12-04 ENCOUNTER — Encounter (HOSPITAL_BASED_OUTPATIENT_CLINIC_OR_DEPARTMENT_OTHER): Payer: Self-pay

## 2023-12-04 ENCOUNTER — Emergency Department (HOSPITAL_BASED_OUTPATIENT_CLINIC_OR_DEPARTMENT_OTHER)
Admission: EM | Admit: 2023-12-04 | Discharge: 2023-12-04 | Disposition: A | Discharge status: Home / Self Care | Payer: BLUE CROSS/BLUE SHIELD | Attending: Emergency Medicine | Admitting: Emergency Medicine

## 2023-12-04 DIAGNOSIS — Z20822 Contact with and (suspected) exposure to covid-19: Secondary | ICD-10-CM | POA: Insufficient documentation

## 2023-12-04 DIAGNOSIS — R059 Cough, unspecified: Secondary | ICD-10-CM | POA: Diagnosis present

## 2023-12-04 DIAGNOSIS — Z7984 Long term (current) use of oral hypoglycemic drugs: Secondary | ICD-10-CM | POA: Diagnosis not present

## 2023-12-04 DIAGNOSIS — E119 Type 2 diabetes mellitus without complications: Secondary | ICD-10-CM | POA: Diagnosis not present

## 2023-12-04 DIAGNOSIS — J101 Influenza due to other identified influenza virus with other respiratory manifestations: Secondary | ICD-10-CM | POA: Diagnosis not present

## 2023-12-04 LAB — COMPREHENSIVE METABOLIC PANEL
ALT: 19 U/L (ref 0–44)
AST: 22 U/L (ref 15–41)
Albumin: 4 g/dL (ref 3.5–5.0)
Alkaline Phosphatase: 72 U/L (ref 38–126)
Anion gap: 9 (ref 5–15)
BUN: 13 mg/dL (ref 6–20)
CO2: 23 mmol/L (ref 22–32)
Calcium: 9.1 mg/dL (ref 8.9–10.3)
Chloride: 98 mmol/L (ref 98–111)
Creatinine, Ser: 1.05 mg/dL (ref 0.61–1.24)
GFR, Estimated: >60 mL/min (ref >60)
Glucose, Bld: 266 mg/dL — ABNORMAL HIGH (ref 70–99)
Potassium: 3.9 mmol/L (ref 3.5–5.1)
Sodium: 130 mmol/L — ABNORMAL LOW (ref 135–145)
Total Bilirubin: 0.9 mg/dL (ref 0.0–1.2)
Total Protein: 7.8 g/dL (ref 6.5–8.1)

## 2023-12-04 LAB — RESP PANEL BY RT-PCR (RSV, FLU A&B, COVID)  RVPGX2
Influenza A by PCR: POSITIVE — AB
Influenza B by PCR: NEGATIVE
Resp Syncytial Virus by PCR: NEGATIVE
SARS Coronavirus 2 by RT PCR: NEGATIVE

## 2023-12-04 LAB — CBC WITH DIFFERENTIAL/PLATELET
Abs Immature Granulocytes: 0.02 10*3/uL (ref 0.00–0.07)
Basophils Absolute: 0 10*3/uL (ref 0.0–0.1)
Basophils Relative: 0 %
Eosinophils Absolute: 0 10*3/uL (ref 0.0–0.5)
Eosinophils Relative: 0 %
HCT: 44.2 % (ref 39.0–52.0)
Hemoglobin: 14.8 g/dL (ref 13.0–17.0)
Immature Granulocytes: 0 %
Lymphocytes Relative: 5 %
Lymphs Abs: 0.3 10*3/uL — ABNORMAL LOW (ref 0.7–4.0)
MCH: 27.3 pg (ref 26.0–34.0)
MCHC: 33.5 g/dL (ref 30.0–36.0)
MCV: 81.4 fL (ref 80.0–100.0)
Monocytes Absolute: 0.6 10*3/uL (ref 0.1–1.0)
Monocytes Relative: 8 %
Neutro Abs: 6 10*3/uL (ref 1.7–7.7)
Neutrophils Relative %: 87 %
Platelets: 210 10*3/uL (ref 150–400)
RBC: 5.43 MIL/uL (ref 4.22–5.81)
RDW: 13.8 % (ref 11.5–15.5)
WBC: 6.9 10*3/uL (ref 4.0–10.5)
nRBC: 0 % (ref 0.0–0.2)

## 2023-12-04 LAB — CBG MONITORING, ED: Glucose-Capillary: 284 mg/dL — ABNORMAL HIGH (ref 70–99)

## 2023-12-04 LAB — HEMOGLOBIN A1C
Hgb A1c MFr Bld: 11.2 % — ABNORMAL HIGH (ref 4.8–5.6)
Mean Plasma Glucose: 274.74 mg/dL

## 2023-12-04 MED ORDER — LACTATED RINGERS IV BOLUS
1000.0000 mL | Freq: Once | INTRAVENOUS | Status: AC
Start: 2023-12-04 — End: 2023-12-04
  Administered 2023-12-04: 1000 mL via INTRAVENOUS

## 2023-12-04 MED ORDER — METFORMIN HCL 500 MG PO TABS
500.0000 mg | ORAL_TABLET | Freq: Two times a day (BID) | ORAL | 0 refills | Status: DC
  Filled 2023-12-04: qty 60, 30d supply, fill #0

## 2023-12-04 MED ORDER — OSELTAMIVIR PHOSPHATE 75 MG PO CAPS
75.0000 mg | ORAL_CAPSULE | Freq: Two times a day (BID) | ORAL | 0 refills | Status: AC
  Filled 2023-12-04: qty 10, 5d supply, fill #0

## 2023-12-04 MED ORDER — ONDANSETRON 4 MG PO TBDP
4.0000 mg | ORAL_TABLET | Freq: Three times a day (TID) | ORAL | 0 refills | Status: DC | PRN
  Filled 2023-12-04: qty 20, 7d supply, fill #0

## 2023-12-04 MED ORDER — ACETAMINOPHEN 325 MG PO TABS
650.0000 mg | ORAL_TABLET | Freq: Once | ORAL | Status: AC | PRN
  Administered 2023-12-04: 650 mg via ORAL
  Filled 2023-12-04: qty 2

## 2023-12-04 NOTE — Care Management (Signed)
 Transition of Care [facility]) - Emergency Department Mini Assessment   Patient Details  Name: Herbert Barnes MRN: [id number] Date of Birth: [birth year]  Transition of Care [facility]) CM/SW Contact:    Stefan JONELLE Cory, RN Phone Number: 12/04/2023, 3:37 PM   Clinical Narrative: Herbert Barnes consult for PCP needs. Per chart review patient currently at [facility] for cough and chills. This RNCM left voicemail for call back. Patient can contact customer service phone number on insurance card to select an in network PCP or follow up with [facility] to schedule PCP appointment himself, which is attached to AVS.  TOC will await a call back from patient for further assistance.    ED Mini Assessment: What brought you to the Emergency Department? : patient has c/o coughing and chills  Barriers to Discharge: ED PCP establishment  Barrier interventions: assist with PCP establishment  Means of departure: Car  Interventions which prevented an admission or readmission: PCP counseling    Patient Contact and Communications        ,          Patient states their goals for this hospitalization and ongoing recovery are:: to feel better CMS Medicare.gov Compare Post Acute Care list provided to:: Patient Choice offered to / list presented to : Patient  Admission diagnosis:  SOB, Vomiting, cough Patient Active Problem List   Diagnosis Date Noted   New onset type 2 diabetes mellitus (HCC)    Hypokalemia    Near syncope    Syncope 03/08/2015   DM hyperosmolarity type II (HCC) 03/08/2015   Hyperglycemia 03/08/2015   PCP:  Patient, No Pcp Per Pharmacy:   [id number] [place], [place] - [address] [place] [address] Phone: [phone] Fax: [phone]  [id number] - [place], [place] - [address] AT [facility] [address] [place] [address] Phone: [phone] Fax: [phone]  [id number] - [place], [place] -  [address] AT [facility] [address] [place] [address] Phone: [phone] Fax: [phone]  [id number] [place], [place] - [address] AT [facility] [address] [place] [address] Phone: [phone] Fax: [phone]  [facility] [place] - [facility] [address] [place] [address] Phone: [phone] Fax: [phone]

## 2023-12-04 NOTE — ED Triage Notes (Signed)
 Pt was at work and suddenly began coughing. Pt states it was productive. Denies any vomiting or diarrhea. Pt has been having fevers.

## 2023-12-04 NOTE — ED Provider Notes (Signed)
 [place] EMERGENCY DEPARTMENT AT [facility] [place] Provider Note   CSN: [id number] Arrival date & time: 12/04/23  1214     History  Chief Complaint  Patient presents with   Cough    Herbert Barnes is a 48 y.o. male.  HPI     Started coughing really hard yesterday, when got off of work was coughing hard, then felt like going to vomit, then got inside.  Vomited again. Coughing so hard, it hurts ribs, back.  No mucus left to cough up.  Coughing up a lot of mucus.  Fever here felt like it at home, chills.  No diarrhea. A little runny nose, no sore throat.    Hx of DM, has not checked in a long time.  Is not on anything.   Past Medical History:  Diagnosis Date   Diabetes mellitus without complication (HCC)    Environmental allergies    Headache    hx of migraine in past    Home Medications Prior to Admission medications   Medication Sig Start Date End Date Taking? Authorizing Provider  metFORMIN  (GLUCOPHAGE ) 500 MG tablet Take 1 tablet (500 mg total) by mouth 2 (two) times daily with a meal. 12/04/23  Yes Dreama Longs, MD  ondansetron  (ZOFRAN -ODT) 4 MG disintegrating tablet Take 1 tablet (4 mg total) by mouth every 8 (eight) hours as needed for nausea or vomiting. 12/04/23  Yes Dreama Longs, MD  oseltamivir  (TAMIFLU ) 75 MG capsule Take 1 capsule (75 mg total) by mouth every 12 (twelve) hours for 5 days. 12/04/23 12/09/23 Yes Dreama Longs, MD  aspirin -acetaminophen -caffeine (EXCEDRIN MIGRAINE) 250-250-65 MG tablet Take 2 tablets by mouth every 6 (six) hours as needed for headache.    [provider]  benzonatate  (TESSALON ) 100 MG capsule Take 1 capsule (100 mg total) by mouth 3 (three) times daily as needed for cough. Patient not taking: No sig reported 09/04/18   Meryle Ip A, PA-C  clotrimazole  (LOTRIMIN ) 1 % cream Apply to affected area 2 times daily 10/17/20   Patel, Shalyn, PA-C  diphenhydrAMINE  (BENADRYL ) 25 MG tablet Take 1 tablet (25 mg total) by  mouth every 6 (six) hours as needed. 07/01/23   Small, Brooke L, PA  doxycycline  (VIBRAMYCIN ) 100 MG capsule Take 1 capsule (100 mg total) by mouth 2 (two) times daily. 07/01/23   Small, Brooke L, PA  fluticasone  (FLONASE ) 50 MCG/ACT nasal spray Place 2 sprays into both nostrils daily. 01/09/22   Theotis Peers M, PA-C  methocarbamol  (ROBAXIN ) 500 MG tablet Take 1 tablet (500 mg total) by mouth every 8 (eight) hours as needed for muscle spasms. Patient not taking: No sig reported 12/11/18   Petrucelli, Samantha R, PA-C  naproxen  (NAPROSYN ) 500 MG tablet Take 1 tablet (500 mg total) by mouth 2 (two) times daily. 08/21/22   Adolph Tinnie BRAVO, PA-C  omeprazole  (PRILOSEC) 20 MG capsule Take 1 capsule (20 mg total) by mouth daily. 12/31/18   Theadore Ozell HERO, MD  oxymetazoline  (AFRIN NASAL SPRAY) 0.05 % nasal spray Place 1 spray into both nostrils 2 (two) times daily. 01/09/22   Theotis Peers M, PA-C  sucralfate  (CARAFATE ) 1 g tablet Take 1 tablet (1 g total) by mouth 4 (four) times daily as needed. 12/31/18   Theadore Ozell HERO, MD      Allergies    Codeine    Review of Systems   Review of Systems  Physical Exam Updated Vital Signs BP 137/87   Pulse 94   Temp ROLLEN)  101.2 F (38.4 C) (Oral)   Resp 20   SpO2 97%  Physical Exam Vitals and nursing note reviewed.  Constitutional:      General: He is not in acute distress.    Appearance: He is well-developed. He is ill-appearing. He is not diaphoretic.  HENT:     Head: Normocephalic and atraumatic.  Eyes:     Conjunctiva/sclera: Conjunctivae normal.  Cardiovascular:     Rate and Rhythm: Normal rate and regular rhythm.     Heart sounds: Normal heart sounds. No murmur heard.    No friction rub. No gallop.  Pulmonary:     Effort: Pulmonary effort is normal. No respiratory distress.     Breath sounds: Normal breath sounds. No wheezing or rales.  Abdominal:     General: There is no distension.     Palpations: Abdomen is soft.     Tenderness: There is no  abdominal tenderness. There is no guarding.  Musculoskeletal:     Cervical back: Normal range of motion.  Skin:    General: Skin is warm and dry.  Neurological:     Mental Status: He is alert and oriented to person, place, and time.     ED Results / Procedures / Treatments   Labs (all labs ordered are listed, but only abnormal results are displayed) Labs Reviewed  RESP PANEL BY RT-PCR (RSV, FLU A&B, COVID)  RVPGX2 - Abnormal; Notable for the following components:      Result Value   Influenza A by PCR POSITIVE (*)    All other components within normal limits  CBC WITH DIFFERENTIAL/PLATELET - Abnormal; Notable for the following components:   Lymphs Abs 0.3 (*)    All other components within normal limits  COMPREHENSIVE METABOLIC PANEL - Abnormal; Notable for the following components:   Sodium 130 (*)    Glucose, Bld 266 (*)    All other components within normal limits  HEMOGLOBIN A1C - Abnormal; Notable for the following components:   Hgb A1c MFr Bld 11.2 (*)    All other components within normal limits  CBG MONITORING, ED - Abnormal; Notable for the following components:   Glucose-Capillary 284 (*)    All other components within normal limits    EKG None  Radiology DG Chest 2 View Result Date: 12/04/2023 CLINICAL DATA:  Productive cough. EXAM: CHEST - 2 VIEW COMPARISON:  12/31/2018 FINDINGS: The heart size and mediastinal contours are within normal limits. Both lungs are clear. The visualized skeletal structures are unremarkable. IMPRESSION: No active cardiopulmonary disease. Electronically Signed   By: Norleen DELENA Kil M.D.   On: 12/04/2023 12:49    Procedures Procedures    Medications Ordered in ED Medications  acetaminophen  (TYLENOL ) tablet 650 mg (650 mg Oral Given 12/04/23 1238)  lactated ringers  bolus 1,000 mL (0 mLs Intravenous Stopped 12/04/23 1608)    ED Course/ Medical Decision Making/ A&P                                  47yo male with history of DM presents  with concern for cough, fever, vomiting.  CXR without signs of pneumonia. Labs with hyperglycemia without signs of DKA, clinically significant electrolyte changes, anemia nor leukocytosis.   Influenza testing positive and suspect this is likely etiology of symptoms.   Has hx of DM with high hgbA1c of 12 in the past, now off of medications for several months.  Placed TOC consult  to find PCP. Ordered hgbA1c for follow up purposes to help guide DM treatment at follow up.   Will start on metformin  today.  Given rx for this as well as tamiflu  for Influenza. Recommend continued supportive care.         Final Clinical Impression(s) / ED Diagnoses Final diagnoses:  Influenza A  Type 2 diabetes mellitus without complication, without long-term current use of insulin  (HCC)    Rx / DC Orders ED Discharge Orders          Ordered    metFORMIN  (GLUCOPHAGE ) 500 MG tablet  2 times daily with meals        12/04/23 1531    ondansetron  (ZOFRAN -ODT) 4 MG disintegrating tablet  Every 8 hours PRN        12/04/23 1531    oseltamivir  (TAMIFLU ) 75 MG capsule  Every 12 hours        12/04/23 1531              Dreama Longs, MD 12/04/23 2242

## 2023-12-31 ENCOUNTER — Emergency Department (HOSPITAL_BASED_OUTPATIENT_CLINIC_OR_DEPARTMENT_OTHER): Payer: Worker's Compensation

## 2023-12-31 ENCOUNTER — Emergency Department (HOSPITAL_BASED_OUTPATIENT_CLINIC_OR_DEPARTMENT_OTHER)
Admission: EM | Admit: 2023-12-31 | Discharge: 2023-12-31 | Disposition: A | Discharge status: Home / Self Care | Payer: Worker's Compensation | Attending: Emergency Medicine | Admitting: Emergency Medicine

## 2023-12-31 ENCOUNTER — Other Ambulatory Visit: Payer: Self-pay

## 2023-12-31 ENCOUNTER — Encounter (HOSPITAL_BASED_OUTPATIENT_CLINIC_OR_DEPARTMENT_OTHER): Payer: Self-pay

## 2023-12-31 DIAGNOSIS — E119 Type 2 diabetes mellitus without complications: Secondary | ICD-10-CM | POA: Diagnosis not present

## 2023-12-31 DIAGNOSIS — X58XXXA Exposure to other specified factors, initial encounter: Secondary | ICD-10-CM | POA: Diagnosis not present

## 2023-12-31 DIAGNOSIS — R0789 Other chest pain: Secondary | ICD-10-CM | POA: Diagnosis present

## 2023-12-31 DIAGNOSIS — Y990 Civilian activity done for income or pay: Secondary | ICD-10-CM | POA: Insufficient documentation

## 2023-12-31 MED ORDER — KETOROLAC TROMETHAMINE 30 MG/ML IJ SOLN
30.0000 mg | Freq: Once | INTRAMUSCULAR | Status: AC
  Administered 2023-12-31: 30 mg via INTRAMUSCULAR
  Filled 2023-12-31: qty 1

## 2023-12-31 MED ORDER — DICLOFENAC SODIUM 1 % EX GEL: 2.0000 g | Freq: Four times a day (QID) | CUTANEOUS | 0 refills | Status: DC | PRN

## 2023-12-31 MED ORDER — IBUPROFEN 800 MG PO TABS: 800.0000 mg | ORAL_TABLET | Freq: Three times a day (TID) | ORAL | 0 refills | Status: DC | PRN

## 2023-12-31 MED ORDER — HYDROCODONE-ACETAMINOPHEN 5-325 MG PO TABS
1.0000 | ORAL_TABLET | Freq: Once | ORAL | Status: AC
Start: 2023-12-31 — End: 2023-12-31
  Administered 2023-12-31: 1 via ORAL
  Filled 2023-12-31: qty 1

## 2023-12-31 MED ORDER — METHOCARBAMOL 500 MG PO TABS: 500.0000 mg | ORAL_TABLET | Freq: Three times a day (TID) | ORAL | 0 refills | Status: DC | PRN

## 2023-12-31 NOTE — ED Provider Notes (Signed)
 Emergency Department Provider Note   I have reviewed the triage vital signs and the nursing notes.   HISTORY  Chief Complaint Chest Injury   HPI Herbert Barnes is a 48 y.o. male with past history of diet Beatties presents emergency department with right-sided chest wall pain after sustaining an injury at work.  Patient was securing a pallet with straps and pulling the strap tight when he suddenly felt a pop in his right chest along with severe pain.  States it brought him to his knees.  He arrives by EMS after they were called to the scene.  Patient's pain is much worse with movement or touching the area.  No rashes.  No similar pain in the past.   Past Medical History:  Diagnosis Date   Diabetes mellitus without complication (HCC)    Environmental allergies    Headache    hx of migraine in past    Review of Systems  Constitutional: No fever/chills Cardiovascular: Positive chest pain. Respiratory: Denies shortness of breath. Gastrointestinal: No abdominal pain.  No nausea, no vomiting.   Genitourinary: Negative for dysuria. Musculoskeletal: Negative for back pain. Skin: Negative for rash. Neurological: Negative for headaches.  ____________________________________________   PHYSICAL EXAM:  VITAL SIGNS: ED Triage Vitals  Encounter Vitals Group     BP 12/31/23 1241 (!) 152/104     Pulse Rate 12/31/23 1241 92     Resp 12/31/23 1241 18     Temp 12/31/23 1241 98.2 F (36.8 C)     Temp src --      SpO2 12/31/23 1241 99 %     Weight 12/31/23 1247 225 lb (102.1 kg)     Height 12/31/23 1247 6' 1 (1.854 m)   Constitutional: Alert and oriented. Well appearing and in no acute distress. Eyes: Conjunctivae are normal. Head: Atraumatic. Nose: No congestion/rhinnorhea. Mouth/Throat: Mucous membranes are moist.  Neck: No stridor.   Cardiovascular: Normal rate, regular rhythm. Good peripheral circulation. Grossly normal heart sounds.   Respiratory: Normal respiratory  effort.  No retractions. Lungs CTAB. Gastrointestinal: No distention.  Musculoskeletal: No lower extremity tenderness nor edema. No gross deformities of extremities. No chest wall bruising, crepitus, or rash.  Neurologic:  Normal speech and language.  Skin:  Skin is warm, dry and intact. No rash noted. ____________________________________________  EKG   EKG Interpretation Date/Time:  Wednesday December 31 2023 12:49:41 EST Ventricular Rate:  85 PR Interval:  172 QRS Duration:  86 QT Interval:  375 QTC Calculation: 446 R Axis:   97  Text Interpretation: Sinus rhythm Borderline right axis deviation Confirmed by Darra Chew [phone]) on 12/31/2023 5:48:29 PM        ____________________________________________  RADIOLOGY  DG Chest 2 View Result Date: 12/31/2023 CLINICAL DATA:  Chest injury while doing heavy lifting today. EXAM: CHEST - 2 VIEW COMPARISON:  12/04/2023 FINDINGS: The heart size and mediastinal contours are within normal limits. Both lungs are clear. No evidence of pneumothorax or pleural effusion. The visualized skeletal structures are unremarkable. IMPRESSION: No active cardiopulmonary disease. Electronically Signed   By: Norleen DELENA Kil M.D.   On: 12/31/2023 14:44    ____________________________________________   PROCEDURES  Procedure(s) performed:   Procedures  None  ____________________________________________   INITIAL IMPRESSION / ASSESSMENT AND PLAN / ED COURSE  Pertinent labs & imaging results that were available during my care of the patient were reviewed by me and considered in my medical decision making (see chart for details).   This patient is Presenting  for Evaluation of CP, which does require a range of treatment options, and is a complaint that involves a high risk of morbidity and mortality.  The Differential Diagnoses includes but is not exclusive to acute coronary syndrome, aortic dissection, pulmonary embolism, cardiac tamponade,  community-acquired pneumonia, pericarditis, musculoskeletal chest wall pain, etc.   Critical Interventions-    Medications  ketorolac  (TORADOL ) 30 MG/ML injection 30 mg (has no administration in time range)  HYDROcodone -acetaminophen  (NORCO/VICODIN) 5-325 MG per tablet 1 tablet (1 tablet Oral Given 12/31/23 1322)    Reassessment after intervention: pain improved.   Radiologic Tests Ordered, included CXR. I independently interpreted the images and agree with radiology interpretation.   Medical Decision Making: Summary:  Patient presents emergency department with chest wall pain.  Pain is very reproducible to palpation and with movement.  Exceedingly low suspicion for ACS or PE although considered.  Chest x-ray without bony abnormality or pneumothorax.  Plan for pain management at home, rest from work, establish with PCP for follow-up.   Patient's presentation is most consistent with acute, uncomplicated illness.   Disposition: discharge  ____________________________________________  FINAL CLINICAL IMPRESSION(S) / ED DIAGNOSES  Final diagnoses:  Chest wall pain     NEW OUTPATIENT MEDICATIONS STARTED DURING THIS VISIT:  New Prescriptions   DICLOFENAC  SODIUM (VOLTAREN ) 1 % GEL    Apply 2 g topically 4 (four) times daily as needed.   IBUPROFEN  (ADVIL ) 800 MG TABLET    Take 1 tablet (800 mg total) by mouth every 8 (eight) hours as needed for moderate pain (pain score 4-6).   METHOCARBAMOL  (ROBAXIN ) 500 MG TABLET    Take 1 tablet (500 mg total) by mouth every 8 (eight) hours as needed for muscle spasms.    Note:  This document was prepared using Dragon voice recognition software and may include unintentional dictation errors.  Fonda Law, MD, [facility] Emergency Medicine    [employer], Fonda MATSU, MD 12/31/23 [phone]

## 2023-12-31 NOTE — Discharge Instructions (Signed)

## 2023-12-31 NOTE — ED Triage Notes (Addendum)
 Patient arrives with [facility] after chest injury today at work at around 11:49 am this morning; patient reports pulling strap to secure items on a pallet. Patient works at a nurse, learning disability. Patient states he heard a pop to right side of chest and that pain radiates to right side of back. Patient is hunched over during triage; states it is painful to sit upright. Patient has hx of DM2 and takes metformin . Patient is able move upper extremities during triage but with pain.  EMS vitals:  150 palpated HR 84 24 RR 99 O2 on room air CBG 280

## 2024-08-11 ENCOUNTER — Other Ambulatory Visit: Payer: Self-pay

## 2024-08-11 ENCOUNTER — Emergency Department (HOSPITAL_BASED_OUTPATIENT_CLINIC_OR_DEPARTMENT_OTHER)
Admission: EM | Admit: 2024-08-11 | Discharge: 2024-08-11 | Disposition: A | Discharge status: Home / Self Care | Payer: Self-pay | Attending: Emergency Medicine | Admitting: Emergency Medicine

## 2024-08-11 ENCOUNTER — Emergency Department (HOSPITAL_BASED_OUTPATIENT_CLINIC_OR_DEPARTMENT_OTHER): Payer: Self-pay

## 2024-08-11 ENCOUNTER — Encounter (HOSPITAL_BASED_OUTPATIENT_CLINIC_OR_DEPARTMENT_OTHER): Payer: Self-pay | Admitting: Emergency Medicine

## 2024-08-11 DIAGNOSIS — E1165 Type 2 diabetes mellitus with hyperglycemia: Secondary | ICD-10-CM | POA: Insufficient documentation

## 2024-08-11 DIAGNOSIS — S80251A Superficial foreign body, right knee, initial encounter: Secondary | ICD-10-CM

## 2024-08-11 DIAGNOSIS — M79604 Pain in right leg: Secondary | ICD-10-CM

## 2024-08-11 DIAGNOSIS — Z7984 Long term (current) use of oral hypoglycemic drugs: Secondary | ICD-10-CM | POA: Insufficient documentation

## 2024-08-11 DIAGNOSIS — Y990 Civilian activity done for income or pay: Secondary | ICD-10-CM | POA: Insufficient documentation

## 2024-08-11 DIAGNOSIS — S81801A Unspecified open wound, right lower leg, initial encounter: Secondary | ICD-10-CM

## 2024-08-11 DIAGNOSIS — L03116 Cellulitis of left lower limb: Secondary | ICD-10-CM

## 2024-08-11 DIAGNOSIS — W450XXA Nail entering through skin, initial encounter: Secondary | ICD-10-CM | POA: Insufficient documentation

## 2024-08-11 LAB — COMPREHENSIVE METABOLIC PANEL WITH GFR
ALT: 14 U/L (ref 0–44)
AST: 16 U/L (ref 15–41)
Albumin: 4.4 g/dL (ref 3.5–5.0)
Alkaline Phosphatase: 93 U/L (ref 38–126)
Anion gap: 8 (ref 5–15)
BUN: 9 mg/dL (ref 6–20)
CO2: 26 mmol/L (ref 22–32)
Calcium: 9.3 mg/dL (ref 8.9–10.3)
Chloride: 101 mmol/L (ref 98–111)
Creatinine, Ser: 1 mg/dL (ref 0.61–1.24)
GFR, Estimated: >60 mL/min (ref >60)
Glucose, Bld: 298 mg/dL — ABNORMAL HIGH (ref 70–99)
Potassium: 4.6 mmol/L (ref 3.5–5.1)
Sodium: 135 mmol/L (ref 135–145)
Total Bilirubin: 0.3 mg/dL (ref 0.0–1.2)
Total Protein: 7.5 g/dL (ref 6.5–8.1)

## 2024-08-11 LAB — CBC WITH DIFFERENTIAL/PLATELET
Abs Immature Granulocytes: 0 K/uL (ref 0.00–0.07)
Basophils Absolute: 0 K/uL (ref 0.0–0.1)
Basophils Relative: 0 %
Eosinophils Absolute: 0.1 K/uL (ref 0.0–0.5)
Eosinophils Relative: 1 %
HCT: 45.2 % (ref 39.0–52.0)
Hemoglobin: 15 g/dL (ref 13.0–17.0)
Immature Granulocytes: 0 %
Lymphocytes Relative: 44 %
Lymphs Abs: 2.2 K/uL (ref 0.7–4.0)
MCH: 27.4 pg (ref 26.0–34.0)
MCHC: 33.2 g/dL (ref 30.0–36.0)
MCV: 82.6 fL (ref 80.0–100.0)
Monocytes Absolute: 0.4 K/uL (ref 0.1–1.0)
Monocytes Relative: 8 %
Neutro Abs: 2.2 K/uL (ref 1.7–7.7)
Neutrophils Relative %: 47 %
Platelets: 293 K/uL (ref 150–400)
RBC: 5.47 MIL/uL (ref 4.22–5.81)
RDW: 13.6 % (ref 11.5–15.5)
WBC: 4.9 K/uL (ref 4.0–10.5)
nRBC: 0 % (ref 0.0–0.2)

## 2024-08-11 LAB — CBG MONITORING, ED: Glucose-Capillary: 342 mg/dL — ABNORMAL HIGH (ref 70–99)

## 2024-08-11 MED ORDER — METFORMIN HCL 500 MG PO TABS
500.0000 mg | ORAL_TABLET | Freq: Once | ORAL | Status: AC
  Administered 2024-08-11: 500 mg via ORAL
  Filled 2024-08-11: qty 1

## 2024-08-11 MED ORDER — AMOXICILLIN-POT CLAVULANATE 875-125 MG PO TABS
1.0000 | ORAL_TABLET | Freq: Once | ORAL | Status: AC
  Administered 2024-08-11: 1 via ORAL
  Filled 2024-08-11: qty 1

## 2024-08-11 MED ORDER — AMOXICILLIN-POT CLAVULANATE 875-125 MG PO TABS: 1.0000 | ORAL_TABLET | Freq: Two times a day (BID) | ORAL | 0 refills | Status: DC

## 2024-08-11 NOTE — Discharge Instructions (Addendum)
 It was a pleasure taking care of you today. Your workup in the emergency department was reassuring. I'm sending you home with antibiotics for infection control. Please schedule an appointment with a PCP for further management of your diabetes and blood glucose control.  Incidental x-ray findings: 5 mm radiopaque foreign body in the infrapatellar anterior soft tissues. Second foreign body suspected in the anterior proximal tibia.  Given your incidental x-ray findings, do not get an MRI until you seek further treatment to confirm what foreign body is in your right leg.

## 2024-08-11 NOTE — ED Provider Notes (Signed)
 [facility] Provider Note   CSN: [id number] Arrival date & time: 08/11/24  9043     Patient presents with: Leg Pain   Herbert Barnes is Barnes 48 y.o. male with PMHX of diabetes who presents to the ED after Barnes laceration to his right leg 2 weeks ago. He states his leg was scraped by Barnes nail at work. Since the injury, patient has kept the wound clean via daily showers. He reported the wound was healing initially and on Thursday, 9/11 he began to notice warmth and erythema around the wound. He has continued to keep it clean via daily showers. Patient also states he does not routinely check his blood glucose and does not take his metformin  prescription.  Leg Pain Associated symptoms: no back pain        Prior to Admission medications   Medication Sig Start Date End Date Taking? Authorizing Provider  aspirin -acetaminophen -caffeine (EXCEDRIN MIGRAINE) 250-250-65 MG tablet Take 2 tablets by mouth every 6 (six) hours as needed for headache.    Provider, Historical, Barnes  benzonatate  (TESSALON ) 100 MG capsule Take 1 capsule (100 mg total) by mouth 3 (three) times daily as needed for cough. Patient not taking: No sig reported 09/04/18   Herbert Ip A, PA-C  clotrimazole  (LOTRIMIN ) 1 % cream Apply to affected area 2 times daily 10/17/20   Patel, Shalyn, PA-C  diclofenac  Sodium (VOLTAREN ) 1 % GEL Apply 2 g topically 4 (four) times daily as needed. 12/31/23   Herbert Barnes  diphenhydrAMINE  (BENADRYL ) 25 MG tablet Take 1 tablet (25 mg total) by mouth every 6 (six) hours as needed. 07/01/23   Small, Herbert L, PA  doxycycline  (VIBRAMYCIN ) 100 MG capsule Take 1 capsule (100 mg total) by mouth 2 (two) times daily. 07/01/23   Small, Herbert L, PA  fluticasone  (FLONASE ) 50 MCG/ACT nasal spray Place 2 sprays into both nostrils daily. 01/09/22   Herbert Barnes HERO, PA-C  ibuprofen  (ADVIL ) 800 MG tablet Take 1 tablet (800 mg total) by mouth every 8 (eight) hours as needed  for moderate pain (pain score 4-6). 12/31/23   Herbert Barnes  metFORMIN  (GLUCOPHAGE ) 500 MG tablet Take 1 tablet (500 mg total) by mouth 2 (two) times daily with Barnes meal. 12/04/23   Herbert Herbert Barnes  methocarbamol  (ROBAXIN ) 500 MG tablet Take 1 tablet (500 mg total) by mouth every 8 (eight) hours as needed for muscle spasms. 12/31/23   Herbert Barnes  naproxen  (NAPROSYN ) 500 MG tablet Take 1 tablet (500 mg total) by mouth 2 (two) times daily. 08/21/22   Herbert Tinnie BRAVO, PA-C  omeprazole  (PRILOSEC) 20 MG capsule Take 1 capsule (20 mg total) by mouth daily. 12/31/18   Herbert Ozell HERO, Barnes  ondansetron  (ZOFRAN -ODT) 4 MG disintegrating tablet Take 1 tablet (4 mg total) by mouth every 8 (eight) hours as needed for nausea or vomiting. 12/04/23   Herbert Herbert Barnes  oxymetazoline  (AFRIN NASAL SPRAY) 0.05 % nasal spray Place 1 spray into both nostrils 2 (two) times daily. 01/09/22   Herbert Barnes M, PA-C  sucralfate  (CARAFATE ) 1 g tablet Take 1 tablet (1 g total) by mouth 4 (four) times daily as needed. 12/31/18   Herbert Ozell HERO, Barnes    Allergies: Codeine    Review of Systems  Constitutional: Negative.   Musculoskeletal:  Negative for arthralgias, back pain and myalgias.    Updated Vital Signs BP (!) 155/98 (BP Location: Left Arm)  Pulse 82   Temp 98.1 F (36.7 C)   Resp 18   Ht 6' 1 (1.854 Barnes)   Wt 97.5 kg   SpO2 99%   BMI 28.37 kg/Barnes  Physical Exam Constitutional:      Appearance: Normal appearance.  Musculoskeletal:     Left lower leg: Laceration present.     Comments: See photo below  Skin:    Findings: Signs of injury and wound present.  Neurological:     Mental Status: He is alert.       (all labs ordered are listed, but only abnormal results are displayed) Labs Reviewed  COMPREHENSIVE METABOLIC PANEL WITH GFR - Abnormal; Notable for the following components:      Result Value   Glucose, Bld 298 (*)    All other components within normal limits  CBG MONITORING, ED -  Abnormal; Notable for the following components:   Glucose-Capillary 342 (*)    All other components within normal limits  CBC WITH DIFFERENTIAL/PLATELET    EKG: None  Radiology: DG Tibia/Fibula Right Result Date: 08/11/2024 CLINICAL DATA:  Rule out foreign body. EXAM: RIGHT TIBIA AND FIBULA - 2 VIEW COMPARISON:  None Available. FINDINGS: 5 mm foreign body noted anteriorly in the infrapatellar soft tissues. Second foreign body noted on the AP view which appears to be within the anterior proximal tibia on the lateral view. No fracture. Disc spaces maintained. No joint effusion. IMPRESSION: 5 mm radiopaque foreign body in the infrapatellar anterior soft tissues. Second foreign body suspected in the anterior proximal tibia. No acute bony abnormality. Electronically Signed   By: Herbert Barnes Barnes.D.   On: 08/11/2024 13:54      Medications Ordered in the ED  metFORMIN  (GLUCOPHAGE ) tablet 500 mg (500 mg Oral Given 08/11/24 1336)  amoxicillin -clavulanate (AUGMENTIN ) 875-125 MG per tablet 1 tablet (1 tablet Oral Given 08/11/24 1336)    Clinical Course as of 08/11/24 1414  Wed Aug 11, 2024  1312 WBC: 4.9 [CA]  1313 Glucose(!): 298 [CA]  1313 Anion gap: 8 [CA]    Clinical Course User Index [CA] Herbert Aleck BROCKS, PA-C                                Medical Decision Making This patient presents to the ED for concern of leg injury, this involves an extensive number of treatment options, and is Barnes complaint that carries with it Barnes high risk of complications and morbidity.  DDX includes cellulitis, localized superficial wound infection, subcutaneous or peri-wound abscess, retained foreign body reaction,, normal post traumatic inflammatory healing response, venous stasis dermatitis.  Co morbidities:      type II diabetes  Social Determinants of Health:       lack of PCP - tobacco use  Additional history:  Lab Tests:  I Ordered, and personally interpreted labs.  The pertinent results  include:  normal WBC count (4.9), blood glucose 298   Imaging Studies:  I ordered imaging studies including LLE xray I independently visualized and interpreted imaging which showed 5 mm radiopaque foreign body in the infrapatellar anterior soft tissues. Second foreign body suspected in the anterior proximal tibia. This is nowhere near the laceration site.  I agree with the radiologist interpretation   Medicines ordered and prescription drug management:  I ordered medication including Medications metFORMIN  (GLUCOPHAGE ) tablet 500 mg (has no administration in time range) amoxicillin -clavulanate (AUGMENTIN ) 875-125 MG per tablet 1 tablet (has no  administration in time range) To cover for possible superficial skin infection. Reevaluation of the patient after these medicines showed that the patient improved I have reviewed the patients home medicines and have made adjustments as needed   Critical Interventions:       None  Consultations Obtained: None  Problem List / ED Course:       Superficial healing wound on right lower extremity - Elevated blood glucose in setting of prior diabetes diagnosis  MDM:   - Lack of fever, systemic symptoms makes cellulitis less likely - No evidence of edema or spread of infection makes cellulitis less likely - No fever, normal WBC count makes infection overall less likely -  xray findings show no evidence of foreign body near wound site so foreign body retention less likely - bilateral equal DP pulses make venous stasis dermatitis unlikely  - Given cleanliness of wound, appears to be most likely normal wound healing reaction  - In setting of diabetes and poor compliance with medication, will treat for superficial skin infection and provide dose of metformin    Dispostion:  After consideration of the diagnostic results and the patients response to treatment, I feel that the patent would benefit from restarting his metformin  BID and Barnes 7 day  course of antibiotics to clear any possible infection.    Amount and/or Complexity of Data Reviewed Labs: ordered. Radiology: ordered.  Risk Prescription drug management.    Final diagnoses:  Wound of right lower extremity, initial encounter  Right leg pain    ED Discharge Orders     None          Torrence Marry GORMAN DEVONNA 08/11/24 1426    Rogelia Jerilynn GORMAN, Barnes 08/12/24 [phone]

## 2024-08-11 NOTE — ED Triage Notes (Addendum)
 Injury to left calf  from a nail he states now more red and has heat happened 2 weeks ago  pt is diabetic and has not checked sugar or been taking his meds

## 2024-08-17 ENCOUNTER — Telehealth: Payer: Self-pay | Admitting: General Practice

## 2024-08-17 NOTE — Telephone Encounter (Unsigned)
 Copied from CRM #[id number]. Topic: Appointments - Scheduling Inquiry for Clinic >> Aug 17, 2024  2:44 PM Donee H wrote: Reason for CRM: Patient's wife Rocky called stating patient needs to be seen for a ER follow and would like to establish care. He currently does not have a pcp. Although it currently shows no providers are accepting new patients, patient would prefer office. Patient was discharged from Er on Sept. 17. He would prefer an afternoon appointment if possible. Please follow up with patient on request at either [phone] and if he can't be reached call wife Rocky  [phone]

## 2024-09-01 ENCOUNTER — Encounter: Payer: Self-pay | Admitting: Family Medicine

## 2024-09-01 ENCOUNTER — Ambulatory Visit: Payer: Self-pay | Admitting: Family Medicine

## 2024-09-01 VITALS — BP 129/82 | HR 68 | Ht 73.0 in | Wt 207.2 lb

## 2024-09-01 DIAGNOSIS — F17200 Nicotine dependence, unspecified, uncomplicated: Secondary | ICD-10-CM

## 2024-09-01 DIAGNOSIS — E1169 Type 2 diabetes mellitus with other specified complication: Secondary | ICD-10-CM

## 2024-09-01 DIAGNOSIS — Z91148 Patient's other noncompliance with medication regimen for other reason: Secondary | ICD-10-CM

## 2024-09-01 LAB — POCT GLYCOSYLATED HEMOGLOBIN (HGB A1C): HbA1c, POC (controlled diabetic range): 9.6 % — AB (ref 0.0–7.0)

## 2024-09-01 NOTE — Progress Notes (Unsigned)
 Established Patient Office Visit  Subjective    Patient ID: Herbert Barnes, male    DOB: [birth year]  Age: 48 y.o. MRN: [id number]  CC:  Chief Complaint  Patient presents with   Establish Care   Hospitalization Follow-up    HPI Herbert Barnes presents for follow up of recent ED visit. Patient reports that he had a cut on his leg and he wanted it evaluated so he would not get a serious infection. He also reports that he has been having elevated blood sugars. He has not been taking his metformin , and has had a high carb diet.  Patient denies acute complaints.   Outpatient Encounter Medications as of 09/01/2024  Medication Sig   amoxicillin -clavulanate (AUGMENTIN ) 875-125 MG tablet Take 1 tablet by mouth every 12 (twelve) hours.   fluticasone  (FLONASE ) 50 MCG/ACT nasal spray Place 2 sprays into both nostrils daily.   metFORMIN  (GLUCOPHAGE ) 500 MG tablet Take 1 tablet (500 mg total) by mouth 2 (two) times daily with a meal.   aspirin -acetaminophen -caffeine (EXCEDRIN MIGRAINE) 250-250-65 MG tablet Take 2 tablets by mouth every 6 (six) hours as needed for headache. (Patient not taking: Reported on 09/01/2024)   benzonatate  (TESSALON ) 100 MG capsule Take 1 capsule (100 mg total) by mouth 3 (three) times daily as needed for cough. (Patient not taking: No sig reported)   clotrimazole  (LOTRIMIN ) 1 % cream Apply to affected area 2 times daily   diclofenac  Sodium (VOLTAREN ) 1 % GEL Apply 2 g topically 4 (four) times daily as needed.   diphenhydrAMINE  (BENADRYL ) 25 MG tablet Take 1 tablet (25 mg total) by mouth every 6 (six) hours as needed. (Patient not taking: Reported on 09/01/2024)   doxycycline  (VIBRAMYCIN ) 100 MG capsule Take 1 capsule (100 mg total) by mouth 2 (two) times daily. (Patient not taking: Reported on 09/01/2024)   ibuprofen  (ADVIL ) 800 MG tablet Take 1 tablet (800 mg total) by mouth every 8 (eight) hours as needed for moderate pain (pain score 4-6). (Patient not taking: Reported on  09/01/2024)   methocarbamol  (ROBAXIN ) 500 MG tablet Take 1 tablet (500 mg total) by mouth every 8 (eight) hours as needed for muscle spasms. (Patient not taking: Reported on 09/01/2024)   naproxen  (NAPROSYN ) 500 MG tablet Take 1 tablet (500 mg total) by mouth 2 (two) times daily. (Patient not taking: Reported on 09/01/2024)   omeprazole  (PRILOSEC) 20 MG capsule Take 1 capsule (20 mg total) by mouth daily. (Patient not taking: Reported on 09/01/2024)   ondansetron  (ZOFRAN -ODT) 4 MG disintegrating tablet Take 1 tablet (4 mg total) by mouth every 8 (eight) hours as needed for nausea or vomiting. (Patient not taking: Reported on 09/01/2024)   oxymetazoline  (AFRIN NASAL SPRAY) 0.05 % nasal spray Place 1 spray into both nostrils 2 (two) times daily.   sucralfate  (CARAFATE ) 1 g tablet Take 1 tablet (1 g total) by mouth 4 (four) times daily as needed. (Patient not taking: Reported on 09/01/2024)   No facility-administered encounter medications on file as of 09/01/2024.    Past Medical History:  Diagnosis Date   Diabetes mellitus without complication (HCC)    Environmental allergies    Headache    hx of migraine in past    Past Surgical History:  Procedure Laterality Date   HAND SURGERY      Family History  Problem Relation Age of Onset   CAD Mother    Diabetes type II Father     Social History   Socioeconomic History  Marital status: Legally Separated    Spouse name: Not on file   Number of children: Not on file   Years of education: Not on file   Highest education level: Not on file  Occupational History   Not on file  Tobacco Use   Smoking status: Every Day    Current packs/day: 0.25    Average packs/day: 0.3 packs/day for 27.0 years (6.8 ttl pk-yrs)    Types: Cigarettes   Smokeless tobacco: Never  Vaping Use   Vaping status: Former  Substance and Sexual Activity   Alcohol use: Yes    Comment: rarely   Drug use: No   Sexual activity: Not on file  Other Topics Concern   Not  on file  Social History Narrative   Not on file   Social Drivers of Health   Financial Resource Strain: Not on file  Food Insecurity: Not on file  Transportation Needs: Not on file  Physical Activity: Not on file  Stress: Not on file  Social Connections: Not on file  Intimate Partner Violence: Not on file    Review of Systems  All other systems reviewed and are negative.       Objective    BP 129/82   Pulse 68   Ht 6' 1 (1.854 m)   Wt 207 lb 3.2 oz (94 kg)   SpO2 98%   BMI 27.34 kg/m   Physical Exam Vitals and nursing note reviewed.  Constitutional:      General: He is not in acute distress. Cardiovascular:     Rate and Rhythm: Normal rate and regular rhythm.  Pulmonary:     Effort: Pulmonary effort is normal.     Breath sounds: Normal breath sounds.  Abdominal:     Palpations: Abdomen is soft.     Tenderness: There is no abdominal tenderness.  Neurological:     General: No focal deficit present.     Mental Status: He is alert and oriented to person, place, and time.         Assessment & Plan:  1. Type 2 diabetes mellitus with other specified complication, without long-term current use of insulin  (HCC) (Primary) Patient non compliant - discussed compliance. A1c elevated above goal.  Patient to restart metformin . Referred to [facility] for med management.  - POCT glycosylated hemoglobin (Hb A1C)  2. Smoker Discussed reduction/cessation     Return in about 4 months (around 01/02/2025) for follow up, chronic med issues.   Tanda Raguel SQUIBB, MD

## 2024-09-02 ENCOUNTER — Encounter: Payer: Self-pay | Admitting: Family Medicine

## 2024-09-28 ENCOUNTER — Other Ambulatory Visit: Payer: Self-pay

## 2024-09-28 ENCOUNTER — Ambulatory Visit: Payer: Self-pay | Attending: Family Medicine | Admitting: Pharmacist

## 2024-09-28 ENCOUNTER — Encounter: Payer: Self-pay | Admitting: Pharmacist

## 2024-09-28 DIAGNOSIS — E1169 Type 2 diabetes mellitus with other specified complication: Secondary | ICD-10-CM

## 2024-09-28 DIAGNOSIS — Z7984 Long term (current) use of oral hypoglycemic drugs: Secondary | ICD-10-CM

## 2024-09-28 DIAGNOSIS — E119 Type 2 diabetes mellitus without complications: Secondary | ICD-10-CM

## 2024-09-28 MED ORDER — METFORMIN HCL ER 500 MG PO TB24
1000.0000 mg | ORAL_TABLET | Freq: Two times a day (BID) | ORAL | 3 refills | Status: DC
  Filled 2024-09-28: qty 120, 30d supply, fill #0

## 2024-09-28 MED ORDER — TRUEPLUS LANCETS 28G MISC
6 refills | Status: AC
  Filled 2024-09-28: qty 100, 33d supply, fill #0

## 2024-09-28 MED ORDER — TRUE METRIX BLOOD GLUCOSE TEST VI STRP
ORAL_STRIP | 6 refills | Status: AC
  Filled 2024-09-28: qty 100, 33d supply, fill #0

## 2024-09-28 MED ORDER — TRUE METRIX METER W/DEVICE KIT
PACK | 0 refills | Status: AC
  Filled 2024-09-28: qty 1, 30d supply, fill #0

## 2024-09-28 NOTE — Progress Notes (Signed)
 S:     No chief complaint on file.  48 y.o. male who presents for diabetes evaluation, education, and management. Patient arrives in good spirits and presents without  any assistance.   Patient was referred and last seen by Primary Care Provider, Dr. Tanda, on 09/01/2024. At that visit, A1c was 9.6 (down from 11.2 prior).   PMH is significant for T2DM. Patient reports Diabetes was diagnosed in 2016. Per patient, he experienced polydipsia, polyuria, and polyphagia that prompted an ED visit. He was admitted for several days. The physician there told him his sugar was over 600 mg/dL initially. He was started on metformin  at that time. He was not started on insulin . He managed to improve his A1c with metformin  and lifestyle changes. For example, he increased his exercise and lost weight. Tells me he went from 260 lbs down to 198 lbs.    No known hx of clinical ASCVD, CHF, or CKD. No hx of thyroid cancer or pancreatitis.   Today, he is doing well. Reports adherence to metformin . Endorses some diarrhea with the second daily dose of metformin . Otherwise, he is not experiencing any other symptoms. He does not have a means of checking blood sugar at home.    Family/Social History:  -Fhx: CAD, T2DM -Tobacco: current 0.3 PPD smoker  -Alcohol: none reported  Current diabetes medications include: metformin  500 mg BID Current hypertension medications include: none Current hyperlipidemia medications include: none  Patient reports adherence to taking all medications as prescribed.   Insurance coverage: self-pay  Patient denies hypoglycemic events.  Reported home fasting blood sugars: no meter at home   Reported 2 hour post-meal/random blood sugars: no meter at home  Patient denies polyuria, polydipsia. Patient reports neuropathy (nerve pain). Patient denies visual changes. Patient reports self foot exams.   Patient reported dietary habits: Eats 1 meals/day -Dinner: usually eats his 1 meal  after work  -Snacks: tries to limit snacking. Will snack on a bag of chips or pack of crackers  -Sweets: admits to struggling with cookies  -Drinks: cream and Splenda (will sometimes use sugar), admits to not drinking water  Patient-reported exercise habits:  -Works at a radio broadcast assistant; pulls orders.  -Lots of weight bearing exercise  O:  Lab Results  Component Value Date   HGBA1C 9.6 (A) 09/01/2024   There were no vitals filed for this visit.  Lipid Panel  No results found for: CHOL, TRIG, HDL, CHOLHDL, VLDL, LDLCALC, LDLDIRECT  Clinical Atherosclerotic Cardiovascular Disease (ASCVD): No  The ASCVD Risk score (Arnett DK, et al., 2019) failed to calculate for the following reasons:   Cannot find a previous HDL lab   Cannot find a previous total cholesterol lab   Patient is participating in a Managed Medicaid Plan: no   A/P: Diabetes longstanding currently above goal. Patient is not currently hypoglycemic but is able to verbalize appropriate hypoglycemia management plan. He is having some neuropathy symptoms but is not overly symptomatic from a hyperglycemia standpoint. Medication adherence appears to be okay but the PM dose of metformin  is causing some diarrhea. We will change this to XR and titrate to 1000 mg BID. I will also write for [employer] supplies to get more home data before making additional medication changes.  -Discontinued metformin .  -Start metformin  500 mg XR BID. Use for 1 week, then increase to 1000 mg (2 tablets) BID.  -Extensively discussed pathophysiology of diabetes, recommended lifestyle interventions, dietary effects on blood sugar control.  -Counseled on s/sx of  and management of hypoglycemia.  -Next A1c anticipated 11/2024.   Written patient instructions provided. Patient verbalized understanding of treatment plan.  Total time in face to face counseling 30 minutes.    Follow-up:  Pharmacist in 1 month  Herlene Fleeta Morris, PharmD, [place],  CPP Clinical Pharmacist [facility] [phone]

## 2024-10-07 ENCOUNTER — Emergency Department (HOSPITAL_BASED_OUTPATIENT_CLINIC_OR_DEPARTMENT_OTHER)
Admission: EM | Admit: 2024-10-07 | Discharge: 2024-10-07 | Disposition: A | Discharge status: Home / Self Care | Payer: Self-pay

## 2024-10-07 ENCOUNTER — Other Ambulatory Visit: Payer: Self-pay

## 2024-10-07 ENCOUNTER — Emergency Department (HOSPITAL_BASED_OUTPATIENT_CLINIC_OR_DEPARTMENT_OTHER): Payer: Self-pay

## 2024-10-07 ENCOUNTER — Encounter (HOSPITAL_BASED_OUTPATIENT_CLINIC_OR_DEPARTMENT_OTHER): Payer: Self-pay

## 2024-10-07 DIAGNOSIS — J0110 Acute frontal sinusitis, unspecified: Secondary | ICD-10-CM | POA: Insufficient documentation

## 2024-10-07 DIAGNOSIS — E119 Type 2 diabetes mellitus without complications: Secondary | ICD-10-CM | POA: Insufficient documentation

## 2024-10-07 DIAGNOSIS — F17210 Nicotine dependence, cigarettes, uncomplicated: Secondary | ICD-10-CM | POA: Insufficient documentation

## 2024-10-07 DIAGNOSIS — Z7984 Long term (current) use of oral hypoglycemic drugs: Secondary | ICD-10-CM | POA: Insufficient documentation

## 2024-10-07 LAB — CBC
HCT: 42.9 % (ref 39.0–52.0)
Hemoglobin: 14.2 g/dL (ref 13.0–17.0)
MCH: 27.3 pg (ref 26.0–34.0)
MCHC: 33.1 g/dL (ref 30.0–36.0)
MCV: 82.5 fL (ref 80.0–100.0)
Platelets: 338 K/uL (ref 150–400)
RBC: 5.2 MIL/uL (ref 4.22–5.81)
RDW: 13.8 % (ref 11.5–15.5)
WBC: 9 K/uL (ref 4.0–10.5)
nRBC: 0 % (ref 0.0–0.2)

## 2024-10-07 LAB — BASIC METABOLIC PANEL WITH GFR
Anion gap: 11 (ref 5–15)
BUN: 14 mg/dL (ref 6–20)
CO2: 22 mmol/L (ref 22–32)
Calcium: 9.3 mg/dL (ref 8.9–10.3)
Chloride: 103 mmol/L (ref 98–111)
Creatinine, Ser: 0.83 mg/dL (ref 0.61–1.24)
GFR, Estimated: >60 mL/min (ref >60)
Glucose, Bld: 149 mg/dL — ABNORMAL HIGH (ref 70–99)
Potassium: 4.2 mmol/L (ref 3.5–5.1)
Sodium: 136 mmol/L (ref 135–145)

## 2024-10-07 MED ORDER — AMOXICILLIN-POT CLAVULANATE 875-125 MG PO TABS: 1.0000 | ORAL_TABLET | Freq: Two times a day (BID) | ORAL | 0 refills | Status: DC

## 2024-10-07 NOTE — ED Provider Notes (Signed)
 [facility] Provider Note   CSN: [id number] Arrival date & time: 10/07/24  9049     Patient presents with: Cough and Shortness of Breath   Herbert Barnes is a 48 y.o. male with past medical history significant for diabetes, tobacco use who presents concern for cough with yellow mucus for 2 to 3 weeks, endorses some shortness of breath since yesterday.  He endorses no fever, no chills, no chest pain, denies nausea, vomiting, abdominal pain.  He does smoke around 1 pack of cigarettes per day.  He reports that he had a change in the color of his mucus from yellowish-green to gray which worried him.    Cough Associated symptoms: shortness of breath   Shortness of Breath Associated symptoms: cough        Prior to Admission medications   Medication Sig Start Date End Date Taking? Authorizing Provider  amoxicillin -clavulanate (AUGMENTIN ) 875-125 MG tablet Take 1 tablet by mouth every 12 (twelve) hours. 10/07/24  Yes Becky Colan H, PA-C  aspirin -acetaminophen -caffeine (EXCEDRIN MIGRAINE) 250-250-65 MG tablet Take 2 tablets by mouth every 6 (six) hours as needed for headache. Patient not taking: Reported on 09/01/2024    [provider]  Blood Glucose Monitoring Suppl (TRUE METRIX METER) w/Device KIT Use to check blood sugar 3 times daily. 09/28/24   Newlin, Enobong, MD  fluticasone  (FLONASE ) 50 MCG/ACT nasal spray Place 2 sprays into both nostrils daily. 01/09/22   Theotis Peers M, PA-C  glucose blood (TRUE METRIX BLOOD GLUCOSE TEST) test strip Use to check blood sugar 3 times daily. 09/28/24   Newlin, Enobong, MD  metFORMIN  (GLUCOPHAGE -XR) 500 MG 24 hr tablet Take 2 tablets (1,000 mg total) by mouth 2 (two) times daily with a meal. 09/28/24   Newlin, Enobong, MD  TRUEplus Lancets 28G MISC Use to check blood sugar 3 times daily. 09/28/24   Newlin, Enobong, MD    Allergies: Codeine    Review of Systems  Respiratory:  Positive  for cough and shortness of breath.   All other systems reviewed and are negative.   Updated Vital Signs BP (!) 151/81   Pulse 89   Temp 98.5 F (36.9 C)   Resp 17   Wt 93.9 kg   SpO2 99%   BMI 27.31 kg/m   Physical Exam Vitals and nursing note reviewed.  Constitutional:      General: He is not in acute distress.    Appearance: Normal appearance.  HENT:     Head: Normocephalic and atraumatic.     Comments: Tenderness to palpation over the frontal sinus with mild redness. Eyes:     General:        Right eye: No discharge.        Left eye: No discharge.  Cardiovascular:     Rate and Rhythm: Normal rate and regular rhythm.     Heart sounds: No murmur heard.    No friction rub. No gallop.  Pulmonary:     Effort: Pulmonary effort is normal.     Breath sounds: Normal breath sounds.     Comments: Very mild rhonchi in upper right lobe, no wheezing, stridor, rales throughout. Abdominal:     General: Bowel sounds are normal.     Palpations: Abdomen is soft.  Skin:    General: Skin is warm and dry.     Capillary Refill: Capillary refill takes less than 2 seconds.  Neurological:     Mental Status: He is alert  and oriented to person, place, and time.  Psychiatric:        Mood and Affect: Mood normal.        Behavior: Behavior normal.     (all labs ordered are listed, but only abnormal results are displayed) Labs Reviewed  BASIC METABOLIC PANEL WITH GFR - Abnormal; Notable for the following components:      Result Value   Glucose, Bld 149 (*)    All other components within normal limits  CBC    EKG: EKG Interpretation Date/Time:  Thursday October 07 2024 10:05:37 EST Ventricular Rate:  73 PR Interval:  144 QRS Duration:  84 QT Interval:  361 QTC Calculation: 398 R Axis:   90  Text Interpretation: Sinus rhythm Borderline right axis deviation Baseline wander in lead(s) V1 Compared with prior EKG from 12/31/2023 Confirmed by Gennaro Bouchard ([id number]) on 10/07/2024  10:17:15 AM  Radiology: [facility] Chest 2 View Result Date: 10/07/2024 EXAM: 2 VIEW(S) XRAY OF THE CHEST 10/07/2024 10:29:00 AM COMPARISON: 12/31/2023 CLINICAL HISTORY: cough cough FINDINGS: LUNGS AND PLEURA: No focal pulmonary opacity. No pleural effusion. No pneumothorax. HEART AND MEDIASTINUM: No acute abnormality of the cardiac and mediastinal silhouettes. BONES AND SOFT TISSUES: No acute osseous abnormality. IMPRESSION: 1. No acute cardiopulmonary process. Electronically signed by: Lynwood Seip MD 10/07/2024 11:13 AM EST RP Workstation: [id number]     Procedures   Medications Ordered in the ED - No data to display                                  Medical Decision Making Amount and/or Complexity of Data Reviewed Radiology: ordered.   This patient is a 48 y.o. male  who presents to the ED for concern of shob.   Differential diagnoses prior to evaluation: The emergent differential diagnosis includes, but is not limited to,  asthma exacerbation, COPD exacerbation, acute upper respiratory infection, acute bronchitis, chronic bronchitis, interstitial lung disease, ARDS, PE, pneumonia, atypical ACS, carbon monoxide poisoning, spontaneous pneumothorax, new CHF vs CHF exacerbation, versus other . This is not an exhaustive differential.   Past Medical History / Co-morbidities / Social History: Diabetes, tobacco use  Physical Exam: Physical exam performed. The pertinent findings include: Very mild rhonchi in upper right lobe, no wheezing, stridor, rales throughout.  Tenderness to palpation over the frontal sinus with mild redness.  Lab Tests/Imaging studies: I personally interpreted labs/imaging and the pertinent results include: CBC unremarkable, BMP overall unremarkable other than very mildly elevated glucose at 149.  I independently turbid plain film chest x-ray which shows no evidence of acute intrathoracic abnormality.. I agree with the radiologist interpretation.  Cardiac  monitoring: EKG obtained and interpreted by myself and attending physician which shows: Normal sinus rhythm, borderline right axis deviation   Medications: Given his duration of symptoms, tenderness over his frontal sinus, green mucus his symptoms seem most consistent with an acute bacterial sinusitis especially given the duration for greater than 2 weeks, will plan to treat with Augmentin , given his persistent cough, congestion we briefly discussed also prescribing a burst of steroids, but patient reports that he is recently diabetic, has just gotten his sugar under control and would prefer not to, I think this is very reasonable, we will plan to try the antibiotics, with plan for close follow-up if no improvement   Disposition: After consideration of the diagnostic results and the patients response to treatment, I feel that patient is stable for  discharge with plan as above .   emergency department workup does not suggest an emergent condition requiring admission or immediate intervention beyond what has been performed at this time. The plan is: as above. The patient is safe for discharge and has been instructed to return immediately for worsening symptoms, change in symptoms or any other concerns.   Final diagnoses:  Acute non-recurrent frontal sinusitis    ED Discharge Orders          Ordered    amoxicillin -clavulanate (AUGMENTIN ) 875-125 MG tablet  Every 12 hours        10/07/24 1121               Raequan Vanschaick, [place] H, PA-C 10/07/24 1125    Kammerer, Duwaine CROME, DO 10/08/24 [phone]

## 2024-10-07 NOTE — Discharge Instructions (Signed)
 Please take the entire course of antibiotics that I prescribed to help treat your sinus infection, as we discussed you may continue to have some lingering cough despite taking the antibiotics, this will continue to improve over time.  Please return if you have significant worsening cough, shortness of breath despite treatment.

## 2024-10-07 NOTE — ED Triage Notes (Signed)
 Cough with yellow mucous x 2weeks. SOB since yesterday.  Denies fever or chills

## 2024-10-27 ENCOUNTER — Other Ambulatory Visit: Payer: Self-pay

## 2024-11-08 ENCOUNTER — Encounter: Payer: Self-pay | Admitting: Pharmacist

## 2024-11-08 ENCOUNTER — Ambulatory Visit: Payer: Self-pay | Attending: Family Medicine | Admitting: Pharmacist

## 2024-11-08 DIAGNOSIS — Z7984 Long term (current) use of oral hypoglycemic drugs: Secondary | ICD-10-CM

## 2024-11-08 DIAGNOSIS — E1169 Type 2 diabetes mellitus with other specified complication: Secondary | ICD-10-CM

## 2024-11-08 NOTE — Progress Notes (Signed)
° ° °  S:     No chief complaint on file.  48 y.o. male who presents for diabetes evaluation, education, and management. Patient arrives in good spirits and presents without  any assistance.   Patient was referred and last seen by Primary Care Provider, Dr. Tanda, on 09/01/2024. At that visit, A1c was 9.6 (down from 11.2 prior). I saw him on 09/28/24 and changed his metformin  to XR due to GI intolerability.    No known hx of clinical ASCVD, CHF, or CKD. No hx of thyroid cancer or pancreatitis.   Today, he is doing well. Reports adherence to metformin . Endorses some improvement in GI side effects since changing to the XR form. Otherwise, he is not experiencing any other symptoms. He is checking his blood sugar at home but does not have his GM with him today.   Family/Social History:  -Fhx: CAD, T2DM -Tobacco: current 0.3 PPD smoker  -Alcohol: none reported  Current diabetes medications include: metformin  500 mg XR tablets - takes 2 tablets (1000 mg) BID Current hypertension medications include: none Current hyperlipidemia medications include: none  Patient reports adherence to taking all medications as prescribed.   Insurance coverage: self-pay  Patient denies hypoglycemic events.  Reported home fasting blood sugars: reports 130s  Reported 2 hour post-meal/random blood sugars: reports 110s-120s  Patient denies polyuria, polydipsia. Patient reports neuropathy (nerve pain). Patient denies visual changes. Patient reports self foot exams.   Patient reported dietary habits: Eats 1 meals/day -Dinner: usually eats his 1 meal after work  -Snacks: tries to limit snacking.  -Since last visit, has cut out rice, potatoes, pasta. Has eliminated for bread. Tries to stick to lean proteins and vegetables. Names grilled chicken salads, beef, corn, green beans  -Drinks: drinks coffee w/ creamer (Zero Sugar), admits to not drinking water   Patient-reported exercise habits:  -Works at a [employer], [employer]; pulls orders.  -Lots of weight bearing exercise  O:  Lab Results  Component Value Date   HGBA1C 9.6 (A) 09/01/2024   There were no vitals filed for this visit.  Lipid Panel  No results found for: CHOL, TRIG, HDL, CHOLHDL, VLDL, LDLCALC, LDLDIRECT  Clinical Atherosclerotic Cardiovascular Disease (ASCVD): No  The ASCVD Risk score (Arnett DK, et al., 2019) failed to calculate for the following reasons:   Cannot find a previous HDL lab   Cannot find a previous total cholesterol lab   * - Cholesterol units were assumed   Patient is participating in a Managed Medicaid Plan: no   A/P: Diabetes longstanding currently above goal given last A1c. However, reported home sugar readings are at goal. Patient is not currently hypoglycemic but is able to verbalize appropriate hypoglycemia management plan. Medication adherence appears to be optimal. Side effects have improved with the use of XR metformin  and he is amenable to continue this. = -Continue metformin  500 mg XR - take 1000 mg (2 tablets) BID.  -Extensively discussed pathophysiology of diabetes, recommended lifestyle interventions, dietary effects on blood sugar control.  -Counseled on s/sx of and management of hypoglycemia.  -Next A1c anticipated 11/2024.   Written patient instructions provided. Patient verbalized understanding of treatment plan.  Total time in face to face counseling 30 minutes.    Follow-up:  Pharmacist in 1 month  Herlene Fleeta Morris, PharmD, [place], CPP Clinical Pharmacist [facility] [phone]

## 2024-11-09 ENCOUNTER — Inpatient Hospital Stay (HOSPITAL_COMMUNITY): Payer: Self-pay

## 2024-11-09 ENCOUNTER — Other Ambulatory Visit (HOSPITAL_COMMUNITY): Payer: Self-pay

## 2024-11-09 ENCOUNTER — Encounter (HOSPITAL_COMMUNITY): Payer: Self-pay

## 2024-11-09 ENCOUNTER — Inpatient Hospital Stay (HOSPITAL_COMMUNITY)
Admission: EM | Admit: 2024-11-09 | Discharge: 2024-11-11 | DRG: 322 | Disposition: A | Discharge status: Home / Self Care | Payer: Self-pay | Attending: Cardiology | Admitting: Cardiology

## 2024-11-09 ENCOUNTER — Inpatient Hospital Stay (HOSPITAL_COMMUNITY): Admission: EM | Disposition: A | Payer: Self-pay | Source: Home / Self Care | Attending: Cardiology

## 2024-11-09 ENCOUNTER — Other Ambulatory Visit: Payer: Self-pay

## 2024-11-09 DIAGNOSIS — Z8249 Family history of ischemic heart disease and other diseases of the circulatory system: Secondary | ICD-10-CM

## 2024-11-09 DIAGNOSIS — Z833 Family history of diabetes mellitus: Secondary | ICD-10-CM

## 2024-11-09 DIAGNOSIS — I255 Ischemic cardiomyopathy: Secondary | ICD-10-CM | POA: Diagnosis present

## 2024-11-09 DIAGNOSIS — Z91199 Patient's noncompliance with other medical treatment and regimen due to unspecified reason: Secondary | ICD-10-CM

## 2024-11-09 DIAGNOSIS — Z7984 Long term (current) use of oral hypoglycemic drugs: Secondary | ICD-10-CM

## 2024-11-09 DIAGNOSIS — F17210 Nicotine dependence, cigarettes, uncomplicated: Secondary | ICD-10-CM | POA: Diagnosis present

## 2024-11-09 DIAGNOSIS — E1165 Type 2 diabetes mellitus with hyperglycemia: Secondary | ICD-10-CM | POA: Diagnosis present

## 2024-11-09 DIAGNOSIS — Z79899 Other long term (current) drug therapy: Secondary | ICD-10-CM

## 2024-11-09 DIAGNOSIS — Z716 Tobacco abuse counseling: Secondary | ICD-10-CM

## 2024-11-09 DIAGNOSIS — Z885 Allergy status to narcotic agent status: Secondary | ICD-10-CM

## 2024-11-09 DIAGNOSIS — I213 ST elevation (STEMI) myocardial infarction of unspecified site: Principal | ICD-10-CM

## 2024-11-09 DIAGNOSIS — E7800 Pure hypercholesterolemia, unspecified: Secondary | ICD-10-CM | POA: Diagnosis present

## 2024-11-09 DIAGNOSIS — I2119 ST elevation (STEMI) myocardial infarction involving other coronary artery of inferior wall: Secondary | ICD-10-CM | POA: Diagnosis present

## 2024-11-09 DIAGNOSIS — E119 Type 2 diabetes mellitus without complications: Secondary | ICD-10-CM

## 2024-11-09 DIAGNOSIS — I2510 Atherosclerotic heart disease of native coronary artery without angina pectoris: Secondary | ICD-10-CM

## 2024-11-09 HISTORY — PX: LEFT HEART CATH AND CORONARY ANGIOGRAPHY: CATH118249

## 2024-11-09 HISTORY — PX: CORONARY/GRAFT ACUTE MI REVASCULARIZATION: CATH118305

## 2024-11-09 LAB — I-STAT CHEM 8, ED
BUN: 16 mg/dL (ref 6–20)
Calcium, Ion: 1.14 mmol/L — ABNORMAL LOW (ref 1.15–1.40)
Chloride: 105 mmol/L (ref 98–111)
Creatinine, Ser: 1 mg/dL (ref 0.61–1.24)
Glucose, Bld: 125 mg/dL — ABNORMAL HIGH (ref 70–99)
HCT: 45 % (ref 39.0–52.0)
Hemoglobin: 15.3 g/dL (ref 13.0–17.0)
Potassium: 4.1 mmol/L (ref 3.5–5.1)
Sodium: 140 mmol/L (ref 135–145)
TCO2: 22 mmol/L (ref 22–32)

## 2024-11-09 LAB — HEPATIC FUNCTION PANEL
ALT: 18 U/L (ref 0–44)
AST: 59 U/L — ABNORMAL HIGH (ref 15–41)
Albumin: 4.4 g/dL (ref 3.5–5.0)
Alkaline Phosphatase: 71 U/L (ref 38–126)
Bilirubin, Direct: 0.1 mg/dL (ref 0.0–0.2)
Indirect Bilirubin: 0.3 mg/dL (ref 0.3–0.9)
Total Bilirubin: 0.4 mg/dL (ref 0.0–1.2)
Total Protein: 7.4 g/dL (ref 6.5–8.1)

## 2024-11-09 LAB — BASIC METABOLIC PANEL WITH GFR
Anion gap: 10 (ref 5–15)
BUN: 15 mg/dL (ref 6–20)
CO2: 24 mmol/L (ref 22–32)
Calcium: 9.6 mg/dL (ref 8.9–10.3)
Chloride: 104 mmol/L (ref 98–111)
Creatinine, Ser: 1.03 mg/dL (ref 0.61–1.24)
GFR, Estimated: >60 mL/min (ref >60)
Glucose, Bld: 125 mg/dL — ABNORMAL HIGH (ref 70–99)
Potassium: 4.4 mmol/L (ref 3.5–5.1)
Sodium: 137 mmol/L (ref 135–145)

## 2024-11-09 LAB — PROTIME-INR
INR: 1 (ref 0.8–1.2)
Prothrombin Time: 13.8 s (ref 11.4–15.2)

## 2024-11-09 LAB — LIPID PANEL
Cholesterol: 198 mg/dL (ref 0–200)
HDL: 36 mg/dL — ABNORMAL LOW (ref >40)
LDL Cholesterol: 135 mg/dL — ABNORMAL HIGH (ref 0–99)
Total CHOL/HDL Ratio: 5.5 ratio
Triglycerides: 132 mg/dL (ref <150)
VLDL: 26 mg/dL (ref 0–40)

## 2024-11-09 LAB — I-STAT CG4 LACTIC ACID, ED: Lactic Acid, Venous: 1.5 mmol/L (ref 0.5–1.9)

## 2024-11-09 LAB — CBC
HCT: 42.7 % (ref 39.0–52.0)
Hemoglobin: 13.9 g/dL (ref 13.0–17.0)
MCH: 27.3 pg (ref 26.0–34.0)
MCHC: 32.6 g/dL (ref 30.0–36.0)
MCV: 83.7 fL (ref 80.0–100.0)
Platelets: 278 K/uL (ref 150–400)
RBC: 5.1 MIL/uL (ref 4.22–5.81)
RDW: 14 % (ref 11.5–15.5)
WBC: 6.1 K/uL (ref 4.0–10.5)
nRBC: 0 % (ref 0.0–0.2)

## 2024-11-09 LAB — POCT ACTIVATED CLOTTING TIME
Activated Clotting Time: 209 s
Activated Clotting Time: 266 s

## 2024-11-09 LAB — APTT: aPTT: 33 s (ref 24–36)

## 2024-11-09 LAB — TROPONIN T, HIGH SENSITIVITY
Troponin T High Sensitivity: 172 ng/L (ref 0–19)
Troponin T High Sensitivity: 1648 ng/L (ref 0–19)

## 2024-11-09 LAB — MRSA NEXT GEN BY PCR, NASAL: MRSA by PCR Next Gen: NOT DETECTED

## 2024-11-09 LAB — CG4 I-STAT (LACTIC ACID): Lactic Acid, Venous: 0.7 mmol/L (ref 0.5–1.9)

## 2024-11-09 MED ORDER — MIDAZOLAM HCL 2 MG/2ML IJ SOLN
INTRAMUSCULAR | Status: AC
  Filled 2024-11-09: qty 2

## 2024-11-09 MED ORDER — NITROGLYCERIN 0.4 MG SL SUBL
0.4000 mg | SUBLINGUAL_TABLET | SUBLINGUAL | Status: DC | PRN
  Administered 2024-11-09: 14:00:00 0.4 mg via SUBLINGUAL

## 2024-11-09 MED ORDER — SODIUM CHLORIDE 0.9% FLUSH
3.0000 mL | Freq: Two times a day (BID) | INTRAVENOUS | Status: DC
  Administered 2024-11-09 – 2024-11-10 (×3): 3 mL via INTRAVENOUS

## 2024-11-09 MED ORDER — SODIUM CHLORIDE 0.9 % IV SOLN: 250.0000 mL | INTRAVENOUS | Status: AC | PRN

## 2024-11-09 MED ORDER — HEPARIN SODIUM (PORCINE) 5000 UNIT/ML IJ SOLN
4000.0000 [IU] | Freq: Once | INTRAMUSCULAR | Status: AC
  Administered 2024-11-09: 14:00:00 4000 [IU] via INTRAVENOUS

## 2024-11-09 MED ORDER — HEPARIN SODIUM (PORCINE) 1000 UNIT/ML IJ SOLN
INTRAMUSCULAR | Status: AC
  Filled 2024-11-09: qty 10

## 2024-11-09 MED ORDER — PRASUGREL HCL 10 MG PO TABS
ORAL_TABLET | ORAL | Status: AC
  Filled 2024-11-09: qty 6

## 2024-11-09 MED ORDER — ONDANSETRON HCL 4 MG/2ML IJ SOLN: 4.0000 mg | Freq: Four times a day (QID) | INTRAMUSCULAR | Status: DC | PRN

## 2024-11-09 MED ORDER — MORPHINE SULFATE (PF) 2 MG/ML IV SOLN: 1.0000 mg | INTRAVENOUS | Status: DC | PRN

## 2024-11-09 MED ORDER — HEPARIN (PORCINE) IN NACL 1000-0.9 UT/500ML-% IV SOLN
INTRAVENOUS | Status: DC | PRN
  Administered 2024-11-09 (×2): 500 mL

## 2024-11-09 MED ORDER — IOHEXOL 350 MG/ML SOLN
INTRAVENOUS | Status: DC | PRN
  Administered 2024-11-09: 15:00:00 90 mL

## 2024-11-09 MED ORDER — PRASUGREL HCL 10 MG PO TABS
ORAL_TABLET | ORAL | Status: DC | PRN
  Administered 2024-11-09: 14:00:00 60 mg via ORAL

## 2024-11-09 MED ORDER — FENTANYL CITRATE (PF) 100 MCG/2ML IJ SOLN
INTRAMUSCULAR | Status: DC | PRN
  Administered 2024-11-09: 14:00:00 25 ug via INTRAVENOUS

## 2024-11-09 MED ORDER — ASPIRIN 81 MG PO CHEW
81.0000 mg | CHEWABLE_TABLET | Freq: Every day | ORAL | Status: DC
  Administered 2024-11-10: 09:00:00 81 mg via ORAL
  Filled 2024-11-09: qty 1

## 2024-11-09 MED ORDER — FREE WATER
500.0000 mL | Freq: Once | Status: AC
  Administered 2024-11-09: 17:00:00 500 mL via ORAL

## 2024-11-09 MED ORDER — ACETAMINOPHEN 325 MG PO TABS
650.0000 mg | ORAL_TABLET | ORAL | Status: DC | PRN
  Administered 2024-11-10 (×2): 650 mg via ORAL
  Filled 2024-11-09 (×2): qty 2

## 2024-11-09 MED ORDER — HYDRALAZINE HCL 20 MG/ML IJ SOLN: 10.0000 mg | INTRAMUSCULAR | Status: AC | PRN

## 2024-11-09 MED ORDER — FENTANYL CITRATE (PF) 100 MCG/2ML IJ SOLN
INTRAMUSCULAR | Status: AC
  Filled 2024-11-09: qty 2

## 2024-11-09 MED ORDER — VERAPAMIL HCL 2.5 MG/ML IV SOLN
INTRAVENOUS | Status: AC
  Filled 2024-11-09: qty 2

## 2024-11-09 MED ORDER — SODIUM CHLORIDE 0.9% FLUSH: 3.0000 mL | INTRAVENOUS | Status: DC | PRN

## 2024-11-09 MED ORDER — NITROGLYCERIN 0.4 MG SL SUBL: 0.4000 mg | SUBLINGUAL_TABLET | SUBLINGUAL | Status: DC | PRN

## 2024-11-09 MED ORDER — LABETALOL HCL 5 MG/ML IV SOLN: 10.0000 mg | INTRAVENOUS | Status: AC | PRN

## 2024-11-09 MED ORDER — NITROGLYCERIN 1 MG/10 ML FOR IR/CATH LAB
INTRA_ARTERIAL | Status: AC
  Filled 2024-11-09: qty 10

## 2024-11-09 MED ORDER — ASPIRIN 81 MG PO CHEW
324.0000 mg | CHEWABLE_TABLET | Freq: Once | ORAL | Status: AC
  Administered 2024-11-09: 14:00:00 324 mg via ORAL
  Filled 2024-11-09: qty 4

## 2024-11-09 MED ORDER — ATORVASTATIN CALCIUM 80 MG PO TABS
80.0000 mg | ORAL_TABLET | Freq: Every day | ORAL | Status: DC
  Administered 2024-11-09 – 2024-11-10 (×2): 80 mg via ORAL
  Filled 2024-11-09 (×2): qty 1

## 2024-11-09 MED ORDER — MIDAZOLAM HCL (PF) 2 MG/2ML IJ SOLN
INTRAMUSCULAR | Status: DC | PRN
  Administered 2024-11-09: 14:00:00 1 mg via INTRAVENOUS

## 2024-11-09 MED ORDER — LIDOCAINE HCL (PF) 1 % IJ SOLN
INTRAMUSCULAR | Status: AC
  Filled 2024-11-09: qty 30

## 2024-11-09 MED ORDER — VERAPAMIL HCL 2.5 MG/ML IV SOLN
INTRAVENOUS | Status: DC | PRN
  Administered 2024-11-09: 14:00:00 10 mL via INTRA_ARTERIAL

## 2024-11-09 MED ORDER — LIDOCAINE HCL (PF) 1 % IJ SOLN
INTRAMUSCULAR | Status: DC | PRN
  Administered 2024-11-09: 14:00:00 2 mL

## 2024-11-09 MED ORDER — PRASUGREL HCL 10 MG PO TABS
10.0000 mg | ORAL_TABLET | Freq: Every day | ORAL | Status: DC
  Administered 2024-11-10: 09:00:00 10 mg via ORAL
  Filled 2024-11-09 (×2): qty 1

## 2024-11-09 MED ORDER — SODIUM CHLORIDE 0.9 % IV SOLN
INTRAVENOUS | Status: AC
  Administered 2024-11-09: 15:00:00 500 mL via INTRAVENOUS

## 2024-11-09 MED ORDER — HEPARIN SODIUM (PORCINE) 1000 UNIT/ML IJ SOLN
INTRAMUSCULAR | Status: DC | PRN
  Administered 2024-11-09: 14:00:00 5000 [IU] via INTRAVENOUS
  Administered 2024-11-09: 15:00:00 4000 [IU] via INTRAVENOUS
  Administered 2024-11-09: 15:00:00 2000 [IU] via INTRAVENOUS

## 2024-11-09 NOTE — H&P (Cosign Needed)
 Cardiology Admission History and Physical   Patient ID: [name] MRN: [id number]; DOB: [birth year]   Admission date: 11/09/2024  PCP:  Tanda Bleacher, MD   [facility] Providers Cardiologist:  Alm Clay, MD     Chief Complaint:  STEMI  Patient Profile: Herbert Barnes is a 48 y.o. male with DM and tobacco use who is being seen 11/09/2024 for the evaluation of chest pain/STEMI.  History of Present Illness: Mr. Herbert Barnes is a 48 yo male with PMH of noted above. Reports a family hx of mother having an MI in her 42s. He has ha several ED visits for various things over the past couple of months. [facility], last seen 08/2024 and restarted on metformin . Notes mention non-compliance issues.   Presented to the ED with complaints of chest pain. Initially reported shortness of breath intermittent for the past couple of weeks. Last evening developed left arm pain and then chest pain this morning prompting him to come the ED.   In the ED EKG showed sinus rhythm with inferior ST elevation, and CODE STEMI called. He was brought directly to the cath lab for emergent cardiac catheterization.   Labs on admission Na+ 140, K+ 4.1, Cr 1.0, WBC 6.1, Hgb 13.9, lactic acid 1.5. Received 324 mg ASA and IV heparin  prior to transfer to the cath lab.    Past Medical History:  Diagnosis Date   Diabetes mellitus without complication (HCC)    Environmental allergies    Headache    hx of migraine in past   Past Surgical History:  Procedure Laterality Date   HAND SURGERY       Medications Prior to Admission: Prior to Admission medications  Medication Sig Start Date End Date Taking? Authorizing Provider  amoxicillin -clavulanate (AUGMENTIN ) 875-125 MG tablet Take 1 tablet by mouth every 12 (twelve) hours. 10/07/24   Prosperi, Christian H, PA-C  aspirin -acetaminophen -caffeine (EXCEDRIN MIGRAINE) 250-250-65 MG tablet Take 2 tablets by mouth  every 6 (six) hours as needed for headache. Patient not taking: Reported on 09/01/2024    [provider]  Blood Glucose Monitoring Suppl (TRUE METRIX METER) w/Device KIT Use to check blood sugar 3 times daily. 09/28/24   Newlin, Enobong, MD  fluticasone  (FLONASE ) 50 MCG/ACT nasal spray Place 2 sprays into both nostrils daily. 01/09/22   Theotis Peers M, PA-C  glucose blood (TRUE METRIX BLOOD GLUCOSE TEST) test strip Use to check blood sugar 3 times daily. 09/28/24   Newlin, Enobong, MD  metFORMIN  (GLUCOPHAGE -XR) 500 MG 24 hr tablet Take 2 tablets (1,000 mg total) by mouth 2 (two) times daily with a meal. 09/28/24   Newlin, Enobong, MD  TRUEplus Lancets 28G MISC Use to check blood sugar 3 times daily. 09/28/24   Newlin, Enobong, MD     Allergies:   Allergies[1]  Social History:   Social History   Socioeconomic History   Marital status: Legally Separated    Spouse name: Not on file   Number of children: Not on file   Years of education: Not on file   Highest education level: Not on file  Occupational History   Not on file  Tobacco Use   Smoking status: Every Day    Current packs/day: 0.25    Average packs/day: 0.3 packs/day for 27.0 years (6.8 ttl pk-yrs)    Types: Cigarettes   Smokeless tobacco: Never  Vaping Use   Vaping status: Former  Substance and Sexual Activity  Alcohol use: Yes    Comment: rarely   Drug use: No   Sexual activity: Not on file  Other Topics Concern   Not on file  Social History Narrative   Not on file   Social Drivers of Health   Tobacco Use: High Risk (11/09/2024)   Patient History    Smoking Tobacco Use: Every Day    Smokeless Tobacco Use: Never    Passive Exposure: Not on file  Financial Resource Strain: Not on file  Food Insecurity: Not on file  Transportation Needs: Not on file  Physical Activity: Not on file  Stress: Not on file  Social Connections: Not on file  Intimate Partner Violence: Not on file  Depression (EYV7-0): Not on  file  Alcohol Screen: Not on file  Housing: Not on file  Utilities: Not on file  Health Literacy: Not on file     Family History:   The patient's family history includes CAD in his mother; Diabetes type II in his father.    ROS:  Please see the history of present illness.  All other ROS reviewed and negative.     Physical Exam/Data: Vitals:   11/09/24 1210 11/09/24 1313 11/09/24 1315 11/09/24 1412  BP:   98/77   Pulse: 81     Resp: 18     Temp: 98.1 F (36.7 C)     SpO2: 100%   96%  Weight:  93.9 kg    Height:  6' 1 (1.854 m)     No intake or output data in the 24 hours ending 11/09/24 1514    11/09/2024    1:13 PM 10/07/2024   10:02 AM 09/01/2024    2:07 PM  Last 3 Weights  Weight (lbs) 207 lb 0.2 oz 207 lb 207 lb 3.2 oz  Weight (kg) 93.9 kg 93.895 kg 93.985 kg     Body mass index is 27.31 kg/m.   Exam per MD  EKG:  The ECG that was done 11/09/2024 was personally reviewed and demonstrates sinus rhythm, inferior ST elevation   Relevant CV Studies:  N/a   Laboratory Data: High Sensitivity Troponin:  No results for input(s): TROPONINIHS in the last 720 hours. No results for input(s): TRNPT in the last 720 hours.      Chemistry Recent Labs  Lab 11/09/24 1407  NA 140  K 4.1  CL 105  GLUCOSE 125*  BUN 16  CREATININE 1.00    No results for input(s): PROT, ALBUMIN, AST, ALT, ALKPHOS, BILITOT in the last 168 hours. Lipids  Recent Labs  Lab 11/09/24 1424  CHOL 198  TRIG 132  HDL 36*  LDLCALC 135*  CHOLHDL 5.5   Hematology Recent Labs  Lab 11/09/24 1317 11/09/24 1407  WBC 6.1  --   RBC 5.10  --   HGB 13.9 15.3  HCT 42.7 45.0  MCV 83.7  --   MCH 27.3  --   MCHC 32.6  --   RDW 14.0  --   PLT 278  --    Thyroid No results for input(s): TSH, FREET4 in the last 168 hours. BNPNo results for input(s): BNP, PROBNP in the last 168 hours.  DDimer No results for input(s): DDIMER in the last 168 hours.  Radiology/Studies:   No results found.   Assessment and Plan:  RAMAN Barnes is a 48 y.o. male with DM and tobacco use who is being seen 11/09/2024 for the evaluation of chest pain/STEMI.  Inferior STEMI -- developed left arm  last evening, then chest pain this morning. In the ED EKG showed ST elevation in inferior leads and CODE STEMI called. Given 324mg  of ASA and IV heparin , brought directly to the cath lab. Further management pending cath  DM -- Hgb A1c 9.6 (08/2024) -- SSI while inpatient  Smoker -- will need cessation counseling   Risk Assessment/Risk Scores:  TIMI Risk Score for ST  Elevation MI:   The patient's TIMI risk score is 1, which indicates a 1.6% risk of all cause mortality at 30 days.{  Code Status: Full Code  Severity of Illness: The appropriate patient status for this patient is INPATIENT. Inpatient status is judged to be reasonable and necessary in order to provide the required intensity of service to ensure the patient's safety. The patient's presenting symptoms, physical exam findings, and initial radiographic and laboratory data in the context of their chronic comorbidities is felt to place them at high risk for further clinical deterioration. Furthermore, it is not anticipated that the patient will be medically stable for discharge from the hospital within 2 midnights of admission.   * I certify that at the point of admission it is my clinical judgment that the patient will require inpatient hospital care spanning beyond 2 midnights from the point of admission due to high intensity of service, high risk for further deterioration and high frequency of surveillance required.*  For questions or updates, please contact [facility] Please consult [email or web address] for contact info under    Signed, Manuelita Rummer, NP  11/09/2024 3:14 PM   . ATTENDING ATTESTATION  I have seen, examined and evaluated the patient upon arrival to the Cath Lab along with Manuelita Rummer,  NP after reviewing all the available data and chart, we discussed the patients laboratory, study & physical findings as well as symptoms in detail.  I agree with her findings, examination as well as impression recommendations as per our discussion.    He was brought emergently to the cardiac catheterization lab where he was found to have an occluded RCA treated with DES PCI.  Was down to 2 out of 10 chest pain upon arrival. Will need aggressive GDMT following catheterization and PCI. Check 2D echo High-dose statin Aggressive glycemic control Monitor for rebound or blood pressure then initiate ARB plus or minus beta-blocker on discharge.    Alm MICAEL Clay, MD, MS Alm Clay, M.D., M.S. Interventional Cardiologist  [facility] Pager # [phone]        [1]  Allergies Allergen Reactions   Codeine Other (See Comments)    threw my head for a loop

## 2024-11-09 NOTE — ED Triage Notes (Signed)
 Complains of left arm pain that started last night and chest pain that started today.  Denies n/v/ sob but complains of headache as well.

## 2024-11-09 NOTE — ED Provider Notes (Signed)
 EKG concerning for STEMI.  Code STEMI activated and patient pulled emergently back to room.   Bari Roxie HERO, DO 11/09/24 1335

## 2024-11-09 NOTE — Brief Op Note (Signed)
 11/09/2024 3:17 PM  PATIENT:  Herbert Barnes  48 y.o. male with DM-2 on oral medications but no other major history besides family history of CAD who presented to [facility] emergency room with complaints of intermittent chest pain.  He began having left arm pain overnight 11/08/2024 but did not have any necessary notable chest discomfort until the day of 11/09/2024 when he started having recurrent arm pain followed by chest tightness and heaviness that was weighing between 8-7 4/10.  He presented to [facility].  Diagnostic EKG at 1327 indicated subtle inferior ST elevations with inverted T waves.  With ongoing chest discomfort and apical EKG, code STEMI called.  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  No complications. Left Coro nary Artery with minimal disease-50% mid LAD at diminutive 2nd Diag.  Is also diminutive apical to 3rd Diag branch; large bifurcating RI, and relative normal LCx which gives rise to a small OM1, terminates as a major OM 2 with small AVG LCx and LPL 1 Low normal to mildly reduced LVEF of roughly 45 to 50% with basal to mid inferior hypokinesis.  Normal LVEDP  PROCEDURE:  Procedures: LEFT HEART CATH AND CORONARY ANGIOGRAPHY (N/A) Coronary/Graft Acute MI Revascularization (N/A)  SURGEON:  Surgeons and Role:    * Anner Alm ORN, MD - Primary  PROCEDURE PERFORMED Time Out: Verified patient identification, verified procedure, site/side was marked, verified correct patient position, special equipment/implants available, medications/allergies/relevent history reviewed, required imaging and test results available. Performed.  Access:  *Right radial Artery: 6 Fr sheath -- Seldinger technique using Micropuncture  Kit -- Direct ultrasound guidance used.  Permanent image obtained and placed on chart. -- 10 mL radial cocktail IA; 5000 units IV Heparin   Left Heart Catheterization: 5 and 6 Fr Catheters advanced or exchanged over a J-wire under direct fluoroscopic guidance into the ascending aorta; take 4.0 catheter advanced first.  * Left Coronary Artery Cineangiography: TIG 4.0 catheter  * Right Coronary Artery Cineangiography: Take 4.0 catheter  * LV Hemodynamics (LV Gram): Angled pigtail catheter  Review of initial angiography revealed: Culprit lesion being 100% RCA occlusion with widely patent LAD, RI and LCx  Preparations are made for RCA PCI -> Additional boluses of IV heparin  3000 & 4000 units given along with oral Prasugrel  60 mg  RCA PCI performed: See FINDINGS 6 French JR4 guide catheter, Prowater wire, 2.0 mm at 15 mm predilation balloon restoring TIMI-3 flow => Onyx Frontier DES 2.5 mm x 34 mm deployed to 2.7 mm and postdilated to 3.1 mm with a 3.0 mm x 20 Miller Herron balloon to high atmospheres. => Reduced to 0%, TIMI-3 flow restored  Upon completion of Angiogaphy, the catheter was removed completely out of the body over a wire, without complication.  Radial sheath removed in the Cardiac Catheterization lab with TR Band placed for hemostasis.  TR Band: 1500 Hours; 12 mL air  MEDICATIONS SQ Lidocaine  3 mL Radial Cocktail: 3 mg Verapmil in 10 mL NS Heparin : Total 11,000 units in addition to 4000 units given in the ER  IV NS bolus 500 Prasugrel  60 mg p.o.   ANESTHESIA:   local, IV sedation, and 3 mg SQ lidocaine ;  IV Versed , 25 mcg fentanyl   EBL:  <20 mL  PATIENT DISPOSITION:  ICU - extubated and stable.  DICTATION: .Note written in EPIC  PLAN OF CARE: Admit to inpatient  => DAPT times minimum 1 year but anticipate longer period; high-dose statin, consider low-dose ARB plus minus SGLT2 inhibitor prior to discharge.  Check 2D echo.   Delay start of Pharmacological VTE agent  (>24hrs) due to surgical blood loss or risk of bleeding: not applicable    Alm Clay, MD

## 2024-11-09 NOTE — ED Triage Notes (Signed)
 C/O left sided chest pain, left sided numbness for a couple of weeks, shob. Denies n/v. C/O headache.

## 2024-11-09 NOTE — ED Provider Notes (Signed)
 [place] EMERGENCY DEPARTMENT AT [facility] Provider Note   CSN: [id number] Arrival date & time: 11/09/24  1204     Patient presents with: Chest Pain and Shortness of Breath   Herbert Barnes is a 48 y.o. male.   The history is provided by the patient, medical records and a significant other. No language interpreter was used.  Chest Pain Pain location:  Substernal area and L chest Pain quality: aching, crushing and pressure   Pain radiates to:  L shoulder and L arm Pain severity:  Severe Onset quality:  Gradual Duration:  1 day Timing:  Constant Progression:  Worsening Chronicity:  New Relieved by:  Nothing Worsened by:  Exertion Ineffective treatments:  None tried Associated symptoms: fatigue, nausea and shortness of breath   Associated symptoms: no abdominal pain, no altered mental status, no back pain, no cough, no diaphoresis, no dizziness, no headache, no palpitations, no vomiting and no weakness   Shortness of Breath Associated symptoms: chest pain   Associated symptoms: no abdominal pain, no cough, no diaphoresis, no headaches, no rash, no vomiting and no wheezing        Prior to Admission medications  Medication Sig Start Date End Date Taking? Authorizing Provider  amoxicillin -clavulanate (AUGMENTIN ) 875-125 MG tablet Take 1 tablet by mouth every 12 (twelve) hours. 10/07/24   Prosperi, Christian H, PA-C  aspirin -acetaminophen -caffeine (EXCEDRIN MIGRAINE) 250-250-65 MG tablet Take 2 tablets by mouth every 6 (six) hours as needed for headache. Patient not taking: Reported on 09/01/2024    [provider]  Blood Glucose Monitoring Suppl (TRUE METRIX METER) w/Device KIT Use to check blood sugar 3 times daily. 09/28/24   Newlin, Enobong, MD  fluticasone  (FLONASE ) 50 MCG/ACT nasal spray Place 2 sprays into both nostrils daily. 01/09/22   Theotis Peers M, PA-C  glucose blood (TRUE METRIX BLOOD GLUCOSE TEST) test strip Use to check blood sugar 3  times daily. 09/28/24   Newlin, Enobong, MD  metFORMIN  (GLUCOPHAGE -XR) 500 MG 24 hr tablet Take 2 tablets (1,000 mg total) by mouth 2 (two) times daily with a meal. 09/28/24   Newlin, Enobong, MD  TRUEplus Lancets 28G MISC Use to check blood sugar 3 times daily. 09/28/24   Newlin, Enobong, MD    Allergies: Codeine    Review of Systems  Constitutional:  Positive for fatigue. Negative for chills and diaphoresis.  HENT:  Negative for congestion.   Respiratory:  Positive for chest tightness and shortness of breath. Negative for cough and wheezing.   Cardiovascular:  Positive for chest pain. Negative for palpitations.  Gastrointestinal:  Positive for nausea. Negative for abdominal pain, constipation, diarrhea and vomiting.  Genitourinary:  Negative for dysuria and flank pain.  Musculoskeletal:  Negative for back pain.  Skin:  Negative for rash and wound.  Neurological:  Positive for light-headedness. Negative for dizziness, weakness and headaches.  Psychiatric/Behavioral:  Negative for agitation.   All other systems reviewed and are negative.   Updated Vital Signs BP 98/77 (BP Location: Left Arm)   Pulse 81   Temp 98.1 F (36.7 C)   Resp 18   Ht 6' 1 (1.854 m)   Wt 93.9 kg   SpO2 100%   BMI 27.31 kg/m   Physical Exam Vitals and nursing note reviewed.  Constitutional:      General: He is in acute distress.     Appearance: He is well-developed. He is ill-appearing. He is not toxic-appearing or diaphoretic.  HENT:     Head: Normocephalic  and atraumatic.     Nose: No congestion or rhinorrhea.     Mouth/Throat:     Mouth: Mucous membranes are moist.     Pharynx: No oropharyngeal exudate or posterior oropharyngeal erythema.  Eyes:     Extraocular Movements: Extraocular movements intact.     Conjunctiva/sclera: Conjunctivae normal.     Pupils: Pupils are equal, round, and reactive to light.  Cardiovascular:     Rate and Rhythm: Normal rate and regular rhythm.     Heart sounds: No  murmur heard. Pulmonary:     Effort: Pulmonary effort is normal. No respiratory distress.     Breath sounds: Normal breath sounds. No wheezing, rhonchi or rales.  Chest:     Chest wall: No tenderness.  Abdominal:     General: Abdomen is flat.     Palpations: Abdomen is soft.     Tenderness: There is no abdominal tenderness. There is no guarding or rebound.  Musculoskeletal:        General: Tenderness present. No swelling.     Cervical back: Neck supple.     Right lower leg: No edema.     Left lower leg: No edema.  Skin:    General: Skin is warm and dry.     Capillary Refill: Capillary refill takes less than 2 seconds.     Findings: No erythema or rash.  Neurological:     General: No focal deficit present.     Mental Status: He is alert.     Sensory: No sensory deficit.     Motor: No weakness.  Psychiatric:        Mood and Affect: Mood normal.     (all labs ordered are listed, but only abnormal results are displayed) Labs Reviewed  LIPID PANEL - Abnormal; Notable for the following components:      Result Value   HDL 36 (*)    LDL Cholesterol 135 (*)    All other components within normal limits  I-STAT CHEM 8, ED - Abnormal; Notable for the following components:   Glucose, Bld 125 (*)    Calcium , Ion 1.14 (*)    All other components within normal limits  MRSA NEXT GEN BY PCR, NASAL  CBC  PROTIME-INR  APTT  BASIC METABOLIC PANEL WITH GFR  HEPATIC FUNCTION PANEL  I-STAT CG4 LACTIC ACID, ED  TROPONIN T, HIGH SENSITIVITY  TROPONIN T, HIGH SENSITIVITY    EKG: EKG Interpretation Date/Time:  Tuesday November 09 2024 13:27:34 EST Ventricular Rate:  63 PR Interval:  156 QRS Duration:  86 QT Interval:  432 QTC Calculation: 442 R Axis:   86  Text Interpretation: ** Critical Test Result: STEMI Normal sinus rhythm Inferior infarct , possibly acute ** ** ACUTE MI / STEMI ** ** Consider right ventricular involvement in acute inferior infarct Abnormal ECG When compared with  ECG of 07-Oct-2024 10:05, PREVIOUS ECG IS PRESENT ** ** ACUTE MI / STEMI ** ** Confirmed by Bari Flank [phone]) on 11/09/2024 1:30:35 PM  Radiology: CARDIAC CATHETERIZATION Result Date: 11/09/2024 Images from the original result were not included.   CULPRIT LESION SEGMENT prox RCA-1 lesion is 55% stenosed. Prox RCA-2 lesion is 100% stenosed. Prox RCA to Mid RCA lesion is 55% stenosed.   A drug-eluting stent was successfully placed covering the entire lesion segment, using a STENT ONYX FRONTIER 2.5X34-deployed to 2.75 mm and postdilated to 3.1 mm..  Post intervention, there is a 0% residual stenosis.   Mid RCA lesion is 40% stenosed.  Dist RCA lesion is 55% stenosed.   Prox RCA-2 lesion is 100% stenosed.   Prox RCA to Mid RCA lesion is 55% stenosed.   -------------------------------------------------------------   Post intervention, there is a 0% residual stenosis.   There is mild left ventricular systolic dysfunction. The left ventricular ejection fraction is 45-50% by visual estimate-basal to mid inferior hypokinesis.   LV end diastolic pressure is normal.   -------------------------------------------------------------   In the absence of any other complications or medical issues, we expect the patient to be ready for discharge from an interventional cardiology perspective on 11/10/2024.   Would need did have time to potentially titrate medications which would be the 1 reason to keep him another day.  If blood pressure is too low to initiate GDMT at this point besides DAPT and high-dose statin. => Could consider low-dose ARB prior to discharge   Recommend dual antiplatelet therapy with Aspirin  81mg  daily and Prasugrel  10mg  daily long-term (beyond 12 months) because of 30+ millimeter stent in the RCA with moderate distal disease. Diagnostic: Dominance: Right     Intervention Inferior STEMI with 100% mid RCA occlusion followed by eccentric 40% mid to distal and 50% distal RCA disease => Successful  revascularization with Onyx Frontier DES 2.5 mm x 34 mm postdilated to 3.1 mm-restoring TIMI-3 flow. No complications. Left Coronary Artery with minimal disease-50% mid LAD at diminutive 2nd Diag. Is also diminutive apical to 3rd Diag branch; large bifurcating RI, and relative normal LCx which gives rise to a small OM1, terminates as a major OM 2 with small AVG LCx and LPL 1 Low normal to mildly reduced LVEF of roughly 45 to 50% with basal to mid inferior hypokinesis. Normal LVEDP RECOMMENDATIONS   In the absence of any other complications or medical issues, we expect the patient to be ready for discharge from an interventional cardiology perspective on 11/10/2024.   Would need did have time to potentially titrate medications which would be the 1 reason to keep him another day.  If blood pressure is too low to initiate GDMT at this point besides DAPT and high-dose statin. => Could consider low-dose ARB prior to discharge   Recommend dual antiplatelet therapy with Aspirin  81mg  daily and Prasugrel  10mg  daily long-term (beyond 12 months) because of 30+ millimeter stent in the RCA with moderate distal disease. Alm Clay, MD     Procedures   CRITICAL CARE Performed by: Lonni PARAS Shayonna Ocampo Total critical care time: 25 minutes Critical care time was exclusive of separately billable procedures and treating other patients. Critical care was necessary to treat or prevent imminent or life-threatening deterioration. Critical care was time spent personally by me on the following activities: development of treatment plan with patient and/or surrogate as well as nursing, discussions with consultants, evaluation of patient's response to treatment, examination of patient, obtaining history from patient or surrogate, ordering and performing treatments and interventions, ordering and review of laboratory studies, ordering and review of radiographic studies, pulse oximetry and re-evaluation of patient's  condition.   Medications Ordered in the ED  0.9 %  sodium chloride  infusion (has no administration in time range)  aspirin  chewable tablet 324 mg (has no administration in time range)                                    Medical Decision Making Amount and/or Complexity of Data Reviewed Labs: ordered. Radiology: ordered.  Risk Prescription drug management.  Herbert Barnes is a 48 y.o. male with a past medical history significant for diabetes and recent left leg cellulitis who presents for chest pain and activated as a code STEMI in triage.  According to patient, his mother died of an MI in her 55s and patient started having left arm soreness and tingling yesterday evening.  He reports today after getting to work he started having significant chest pressure in his central left chest that goes down his left arm.  He had some nausea but no vomiting.  He felt somewhat fatigued and had some shortness of breath.  Denies pleuritic discomfort.  Reports it was exertional.  He reports no trauma.  He denies any fevers, chills, congestion, or cough.  Denies leg pain or leg swelling and reports his left leg cellulitis has resolved after previous antibiotics.  Reports the pain is severe and upon arrival to triage, his EKG was concerning and he was brought to a room immediately.  On my evaluation, he is complaining of severe central and left-sided chest pain.  He is having some mild shortness of breath but he is not hypoxic.  Chest was nontender and I could not reproduce his discomfort.  He did not have a murmur.  He has intact pulses in extremities and legs were nontender and nonedematous.  Patient is ill-appearing.  EKG does indeed show evidence of STEMI with ST elevations and some depressions.  Code STEMI was activated in triage and we will continue it.  He was given some nitroglycerin  to see if this would help his pain and cardiology arrived.  He will go to Cath Lab for further management.  Will  start heparin  and he will be admitted for further management of his STEMI.  Of note, it is [age].    Cardiology will admit for further management.       Final diagnoses:  ST elevation myocardial infarction (STEMI), unspecified artery (HCC)     Clinical Impression: 1. ST elevation myocardial infarction (STEMI), unspecified artery (HCC)     Disposition: Admit  This note was prepared with assistance of Dragon voice recognition software. Occasional wrong-word or sound-a-like substitutions may have occurred due to the inherent limitations of voice recognition software.      Darrel Baroni, Lonni PARAS, MD 11/09/24 [phone]

## 2024-11-09 NOTE — ED Provider Triage Note (Signed)
 Emergency Medicine Provider Triage Evaluation Note  Herbert Barnes , a 48 y.o. male  was evaluated in triage.  Pt complains of chest pain with radiation to the left arm.  Notes that he began to have numbness in his left arm last night, he attributed this to a pinched nerve initially however now is having crushing left-sided chest pain with radiation down the left arm.  Denies nausea/vomiting, cough/shortness of breath.  No history of prior cardiac issues.  Review of Systems  Positive: As above Negative: As above  Physical Exam  BP 98/77 (BP Location: Left Arm)   Pulse 81   Temp 98.1 F (36.7 C)   Resp 18   Ht 6' 1 (1.854 m)   Wt 93.9 kg   SpO2 100%   BMI 27.31 kg/m  Gen:   Awake, in pain  Resp:  Normal effort  MSK:   Moves extremities without difficulty  Other:    Medical Decision Making  Medically screening exam initiated at 1:30 PM.  Appropriate orders placed.  Herbert Barnes was informed that the remainder of the evaluation will be completed by another provider, this initial triage assessment does not replace that evaluation, and the importance of remaining in the ED until their evaluation is complete.  Code STEMI per Dr. Bari, activated in triage and patient was subsequently moved to a room     Herbert Barnes, [place] 11/09/24 1333

## 2024-11-10 ENCOUNTER — Inpatient Hospital Stay (HOSPITAL_COMMUNITY): Payer: MEDICAID

## 2024-11-10 ENCOUNTER — Encounter (HOSPITAL_COMMUNITY): Payer: Self-pay | Admitting: Cardiology

## 2024-11-10 ENCOUNTER — Other Ambulatory Visit (HOSPITAL_COMMUNITY): Payer: Self-pay

## 2024-11-10 ENCOUNTER — Telehealth: Payer: Self-pay | Admitting: Cardiology

## 2024-11-10 DIAGNOSIS — I5021 Acute systolic (congestive) heart failure: Secondary | ICD-10-CM

## 2024-11-10 DIAGNOSIS — E7800 Pure hypercholesterolemia, unspecified: Secondary | ICD-10-CM

## 2024-11-10 DIAGNOSIS — F17200 Nicotine dependence, unspecified, uncomplicated: Secondary | ICD-10-CM

## 2024-11-10 DIAGNOSIS — I2119 ST elevation (STEMI) myocardial infarction involving other coronary artery of inferior wall: Secondary | ICD-10-CM

## 2024-11-10 DIAGNOSIS — I2111 ST elevation (STEMI) myocardial infarction involving right coronary artery: Secondary | ICD-10-CM

## 2024-11-10 DIAGNOSIS — E785 Hyperlipidemia, unspecified: Secondary | ICD-10-CM | POA: Insufficient documentation

## 2024-11-10 DIAGNOSIS — E118 Type 2 diabetes mellitus with unspecified complications: Secondary | ICD-10-CM

## 2024-11-10 DIAGNOSIS — E1165 Type 2 diabetes mellitus with hyperglycemia: Secondary | ICD-10-CM

## 2024-11-10 LAB — CBC
HCT: 38.3 % — ABNORMAL LOW (ref 39.0–52.0)
Hemoglobin: 12.9 g/dL — ABNORMAL LOW (ref 13.0–17.0)
MCH: 27.9 pg (ref 26.0–34.0)
MCHC: 33.7 g/dL (ref 30.0–36.0)
MCV: 82.7 fL (ref 80.0–100.0)
Platelets: 245 K/uL (ref 150–400)
RBC: 4.63 MIL/uL (ref 4.22–5.81)
RDW: 14.2 % (ref 11.5–15.5)
WBC: 6.5 K/uL (ref 4.0–10.5)
nRBC: 0 % (ref 0.0–0.2)

## 2024-11-10 LAB — BASIC METABOLIC PANEL WITH GFR
Anion gap: 9 (ref 5–15)
BUN: 12 mg/dL (ref 6–20)
CO2: 22 mmol/L (ref 22–32)
Calcium: 9.1 mg/dL (ref 8.9–10.3)
Chloride: 102 mmol/L (ref 98–111)
Creatinine, Ser: 0.91 mg/dL (ref 0.61–1.24)
GFR, Estimated: >60 mL/min (ref >60)
Glucose, Bld: 158 mg/dL — ABNORMAL HIGH (ref 70–99)
Potassium: 3.9 mmol/L (ref 3.5–5.1)
Sodium: 133 mmol/L — ABNORMAL LOW (ref 135–145)

## 2024-11-10 LAB — ECHOCARDIOGRAM COMPLETE
AR max vel: 2.97 cm2
AV Area VTI: 3.2 cm2
AV Area mean vel: 2.84 cm2
AV Mean grad: 3 mmHg
AV Peak grad: 5 mmHg
Ao pk vel: 1.12 m/s
Area-P 1/2: 2.83 cm2
Height: 73 in
S' Lateral: 3.3 cm
Weight: 3312.19 [oz_av]

## 2024-11-10 LAB — HEMOGLOBIN A1C
Hgb A1c MFr Bld: 7.2 % — ABNORMAL HIGH (ref 4.8–5.6)
Mean Plasma Glucose: 159.94 mg/dL

## 2024-11-10 LAB — TSH: TSH: 1.04 u[IU]/mL (ref 0.350–4.500)

## 2024-11-10 MED ORDER — CHLORHEXIDINE GLUCONATE CLOTH 2 % EX PADS
6.0000 | MEDICATED_PAD | Freq: Every day | CUTANEOUS | Status: DC
  Administered 2024-11-10: 09:00:00 6 via TOPICAL

## 2024-11-10 MED ORDER — PERFLUTREN LIPID MICROSPHERE
1.0000 mL | INTRAVENOUS | Status: AC | PRN
  Administered 2024-11-10: 12:00:00 2 mL via INTRAVENOUS

## 2024-11-10 MED ORDER — LOSARTAN POTASSIUM 25 MG PO TABS
25.0000 mg | ORAL_TABLET | Freq: Every evening | ORAL | 1 refills | Status: DC
  Filled 2024-11-10: qty 30, 30d supply, fill #0

## 2024-11-10 MED ORDER — EMPAGLIFLOZIN 10 MG PO TABS
10.0000 mg | ORAL_TABLET | Freq: Every day | ORAL | 1 refills | Status: DC
  Filled 2024-11-10: qty 30, 30d supply, fill #0

## 2024-11-10 MED ORDER — EMPAGLIFLOZIN 10 MG PO TABS
10.0000 mg | ORAL_TABLET | Freq: Every day | ORAL | Status: DC
  Administered 2024-11-10: 11:00:00 10 mg via ORAL
  Filled 2024-11-10: qty 1

## 2024-11-10 MED ORDER — ATORVASTATIN CALCIUM 80 MG PO TABS
80.0000 mg | ORAL_TABLET | Freq: Every day | ORAL | 1 refills | Status: DC
  Filled 2024-11-10: qty 30, 30d supply, fill #0

## 2024-11-10 MED ORDER — METFORMIN HCL ER 500 MG PO TB24: 1000.0000 mg | ORAL_TABLET | Freq: Two times a day (BID) | ORAL | Status: DC

## 2024-11-10 MED ORDER — NITROGLYCERIN 0.4 MG SL SUBL
0.4000 mg | SUBLINGUAL_TABLET | SUBLINGUAL | 1 refills | Status: AC | PRN
  Filled 2024-11-10: qty 25, 8d supply, fill #0

## 2024-11-10 MED ORDER — PRASUGREL HCL 10 MG PO TABS
10.0000 mg | ORAL_TABLET | Freq: Every day | ORAL | 2 refills | Status: DC
  Filled 2024-11-10: qty 30, 30d supply, fill #0

## 2024-11-10 MED ORDER — METOPROLOL SUCCINATE ER 25 MG PO TB24
25.0000 mg | ORAL_TABLET | Freq: Every day | ORAL | 1 refills | Status: DC
  Filled 2024-11-10: qty 30, 30d supply, fill #0

## 2024-11-10 MED ORDER — ASPIRIN 81 MG PO CHEW
81.0000 mg | CHEWABLE_TABLET | Freq: Every day | ORAL | 2 refills | Status: DC
  Filled 2024-11-10: qty 30, 30d supply, fill #0

## 2024-11-10 MED FILL — Nitroglycerin IV Soln 100 MCG/ML in D5W: INTRA_ARTERIAL | Qty: 10 | Status: AC

## 2024-11-10 NOTE — Plan of Care (Signed)
°  Problem: Education: Goal: Knowledge of General Education information will improve Description: Including pain rating scale, medication(s)/side effects and non-pharmacologic comfort measures Outcome: Progressing   Problem: Health Behavior/Discharge Planning: Goal: Ability to manage health-related needs will improve Outcome: Progressing   Problem: Clinical Measurements: Goal: Ability to maintain clinical measurements within normal limits will improve Outcome: Progressing Goal: Respiratory complications will improve Outcome: Progressing   Problem: Activity: Goal: Risk for activity intolerance will decrease Outcome: Progressing   Problem: Nutrition: Goal: Adequate nutrition will be maintained Outcome: Progressing   Problem: Safety: Goal: Ability to remain free from injury will improve Outcome: Progressing   Problem: Skin Integrity: Goal: Risk for impaired skin integrity will decrease Outcome: Progressing   Problem: Activity: Goal: Ability to return to baseline activity level will improve Outcome: Progressing   Problem: Cardiovascular: Goal: Vascular access site(s) Level 0-1 will be maintained Outcome: Progressing   Problem: Health Behavior/Discharge Planning: Goal: Ability to safely manage health-related needs after discharge will improve Outcome: Progressing

## 2024-11-10 NOTE — Progress Notes (Signed)
 Patient Name: Herbert Barnes Date of Encounter: 11/10/2024 [facility] Cardiologist: Alm Clay, MD  11/09/2024 .admit Length of stay: 1  Interval Summary  .    Herbert Barnes is a 48 y.o. African-American male patient with hypercholesterolemia, diabetes mellitus presenting with acute inferior STEMI.  He is presently doing well and has not had any further chest pain, however this evening prior to discharge, patient complained of sharp chest pain although felt to be noncardiac in view of his complex medical history, patient is being observed overnight.  He has not had any significant arrhythmias on telemetry.  Physical Exam    Vitals:   11/10/24 1700 11/10/24 1800 11/10/24 1900 11/10/24 2000  BP: 120/70 125/64 115/62 123/69  Pulse: 79 74 68 79  Resp: (!) 22 18 (!) 23 (!) 23  Temp:      TempSrc:      SpO2: 98% 97% 97% 98%  Weight:      Height:       Physical Exam Neck:     Vascular: No carotid bruit or JVD.  Cardiovascular:     Rate and Rhythm: Normal rate and regular rhythm.     Pulses: Intact distal pulses.     Heart sounds: Normal heart sounds. No murmur heard.    No gallop.  Pulmonary:     Effort: Pulmonary effort is normal.     Breath sounds: Normal breath sounds.  Abdominal:     General: Bowel sounds are normal.     Palpations: Abdomen is soft.  Musculoskeletal:     Right lower leg: No edema.     Left lower leg: No edema.        11/09/2024    3:30 PM 11/09/2024    1:13 PM 10/07/2024   10:02 AM  Last 3 Weights  Weight (lbs) 207 lb 0.2 oz 207 lb 0.2 oz 207 lb  Weight (kg) 93.9 kg 93.9 kg 93.895 kg      Labs   Lab Results  Component Value Date   NA 133 (L) 11/10/2024   K 3.9 11/10/2024   CO2 22 11/10/2024   GLUCOSE 158 (H) 11/10/2024   BUN 12 11/10/2024   CREATININE 0.91 11/10/2024   CALCIUM  9.1 11/10/2024   GFRNONAA >60 11/10/2024       Latest Ref Rng & Units 11/10/2024   10:29 AM 11/09/2024    2:07 PM 11/09/2024    1:17  PM  BMP  Glucose 70 - 99 mg/dL 841  874  874   BUN 6 - 20 mg/dL 12  16  15    Creatinine 0.61 - 1.24 mg/dL 9.08  8.99  8.96   Sodium 135 - 145 mmol/L 133  140  137   Potassium 3.5 - 5.1 mmol/L 3.9  4.1  4.4   Chloride 98 - 111 mmol/L 102  105  104   CO2 22 - 32 mmol/L 22   24   Calcium  8.9 - 10.3 mg/dL 9.1   9.6        Latest Ref Rng & Units 11/10/2024   10:29 AM 11/09/2024    2:07 PM 11/09/2024    1:17 PM  CBC  WBC 4.0 - 10.5 K/uL 6.5   6.1   Hemoglobin 13.0 - 17.0 g/dL 87.0  84.6  86.0   Hematocrit 39.0 - 52.0 % 38.3  45.0  42.7   Platelets 150 - 400 K/uL 245   278     Lab Results  Component Value  Date   CHOL 198 11/09/2024   HDL 36 (L) 11/09/2024   LDLCALC 135 (H) 11/09/2024   TRIG 132 11/09/2024   CHOLHDL 5.5 11/09/2024    Lab Results  Component Value Date   TSH 1.040 11/10/2024    Lab Results  Component Value Date   HGBA1C 7.2 (H) 11/10/2024   Tele/EKG/Cardiac studies    Telemetry: NSR  Echocardiogram 11/10/2024   1. Left ventricular ejection fraction, by estimation, is 50%.  The left ventricle demonstrates regional wall motion abnormalities. The inferior wall is akinetic. The posterior wall is hypokinetic.  There is mild concentric left ventricular hypertrophy. Left ventricular diastolic parameters were normal. 2. Right ventricular systolic function is normal. The right ventricular size is normal. 3. The mitral valve is normal in structure. Trivial mitral valve regurgitation. No evidence of mitral stenosis. 4. The aortic valve is tricuspid. Aortic valve regurgitation is not visualized. Aortic valve sclerosis/calcification is present, without any evidence of aortic stenosis. 5. The inferior vena cava is normal in size with greater than 50% respiratory variability, suggesting right atrial pressure of 3 mmHg.  Coronary angiogram 11/09/2024: Successful PTCA and stenting of the mid RCA with implantation of 2.5 x 34 mm Onyx frontier DES postdilated to 3.1 mm       Medications  Current Medications[1]   Current Meds:    Current Medications[2]  Assessment & Plan .     1.  Acute inferior STEMI 2.  Diabetes mellitus with hyperglycemia 3.  Hypercholesterolemia  Recommendations: Patient is presently on appropriate medical therapy with regard to cardiac disease of unstable angina pectoris with acute inferior STEMI, no significant arrhythmias on telemetry.  Continue aspirin  and Effient .  Treat the patient on high intensity statins with Lipitor 80 mg daily.  For diabetes mellitus.  Most appropriate to start Jardiance  10 mg daily for diabetes mellitus.  Patient was being felt stable for discharge, ambulated in the hallway without any chest pain or shortness of breath however in the evening complained of chest pain that appears to be mostly musculoskeletal but plan to observe him for 4 hours prior to discharge  For questions or updates, please contact [facility] Please consult [email or web address] for contact info under        Signed,   Gordy Bergamo, MD, [facility] 11/10/2024, 9:38 PM [facility] [address] [place], [place] [address] Phone: [phone]. Fax:  [phone]      [1]  Current Facility-Administered Medications:    acetaminophen  (TYLENOL ) tablet 650 mg, 650 mg, Oral, Q4H PRN, Anner Alm ORN, MD, 650 mg at 11/10/24 1620   aspirin  chewable tablet 81 mg, 81 mg, Oral, Daily, Anner Alm ORN, MD, 81 mg at 11/10/24 0911   atorvastatin  (LIPITOR) tablet 80 mg, 80 mg, Oral, Daily, Anner Alm ORN, MD, 80 mg at 11/10/24 0911   Chlorhexidine  Gluconate Cloth 2 % PADS 6 each, 6 each, Topical, Daily, Anner Alm ORN, MD, 6 each at 11/10/24 9089   empagliflozin  (JARDIANCE ) tablet 10 mg, 10 mg, Oral, Daily, Tresha Muzio, MD, 10 mg at 11/10/24 1125   morphine  (PF) 2 MG/ML injection 1 mg, 1 mg, Intravenous, Q2H PRN, Anner Alm ORN, MD   nitroGLYCERIN  (NITROSTAT ) SL tablet 0.4 mg, 0.4 mg, Sublingual, Q5 min PRN, Anner Alm ORN, MD, 0.4  mg at 11/09/24 1352   ondansetron  (ZOFRAN ) injection 4 mg, 4 mg, Intravenous, Q6H PRN, Anner Alm ORN, MD   prasugrel  (EFFIENT ) tablet 10 mg, 10 mg, Oral, Daily, Anner Alm ORN, MD, 10 mg at  11/10/24 0911   sodium chloride  flush (NS) 0.9 % injection 3 mL, 3 mL, Intravenous, Q12H, Anner Alm ORN, MD, 3 mL at 11/10/24 0910   sodium chloride  flush (NS) 0.9 % injection 3 mL, 3 mL, Intravenous, PRN, Anner Alm ORN, MD [2]  Current Facility-Administered Medications:    acetaminophen  (TYLENOL ) tablet 650 mg, 650 mg, Oral, Q4H PRN, Anner Alm ORN, MD, 650 mg at 11/10/24 1620   aspirin  chewable tablet 81 mg, 81 mg, Oral, Daily, Anner Alm ORN, MD, 81 mg at 11/10/24 0911   atorvastatin  (LIPITOR) tablet 80 mg, 80 mg, Oral, Daily, Anner Alm ORN, MD, 80 mg at 11/10/24 0911   Chlorhexidine  Gluconate Cloth 2 % PADS 6 each, 6 each, Topical, Daily, Anner Alm ORN, MD, 6 each at 11/10/24 9089   empagliflozin  (JARDIANCE ) tablet 10 mg, 10 mg, Oral, Daily, Jujuan Dugo, MD, 10 mg at 11/10/24 1125   morphine  (PF) 2 MG/ML injection 1 mg, 1 mg, Intravenous, Q2H PRN, Anner Alm ORN, MD   nitroGLYCERIN  (NITROSTAT ) SL tablet 0.4 mg, 0.4 mg, Sublingual, Q5 min PRN, Anner Alm ORN, MD, 0.4 mg at 11/09/24 1352   ondansetron  (ZOFRAN ) injection 4 mg, 4 mg, Intravenous, Q6H PRN, Anner Alm ORN, MD   prasugrel  (EFFIENT ) tablet 10 mg, 10 mg, Oral, Daily, Anner Alm ORN, MD, 10 mg at 11/10/24 9088   sodium chloride  flush (NS) 0.9 % injection 3 mL, 3 mL, Intravenous, Q12H, Anner Alm ORN, MD, 3 mL at 11/10/24 0910   sodium chloride  flush (NS) 0.9 % injection 3 mL, 3 mL, Intravenous, PRN, Anner Alm ORN, MD

## 2024-11-10 NOTE — Telephone Encounter (Signed)
° °  Transition of Care Follow-up Phone Call Request    Patient Name: Herbert Barnes Date of Birth: 13-Jul-1976 Date of Encounter: 11/10/2024  Primary Care Provider:  Tanda Bleacher, MD Primary Cardiologist:  Alm Clay, MD  Herbert Barnes has been scheduled for a transition of care follow up appointment with a [facility] provider:  Thom Sluder 12/30  Please reach out to Herbert Barnes within 48 hours of discharge to confirm appointment and review transition of care protocol questionnaire. Anticipated discharge date: 12/17  Manuelita Rummer, NP  11/10/2024, 4:06 PM

## 2024-11-10 NOTE — Plan of Care (Signed)

## 2024-11-10 NOTE — TOC CM/SW Note (Signed)
 Transition of Care (TOC) CM/SW Note    CSW attached housing and food resources to patients AVS.

## 2024-11-10 NOTE — TOC CM/SW Note (Signed)
 Transition of Care [facility]) - Inpatient Brief Assessment   Patient Details  Name: Herbert Barnes MRN: [id number] Date of Birth: 03-Mar-1976  Transition of Care [facility]) CM/SW Contact:    Sudie Erminio Deems, RN Phone Number: 11/10/2024, 10:50 AM   Clinical Narrative: Patient presented for chest pain/Stemi. PTA patient was independent and states he lives in a [place]. Patient states he was working; however, has not been able to afford insurance premiums. Patient states he has had a hard time affording the hotel and food. CSW was notified for resources. Patient has PCP- CMA to schedule a hospital follow up appointment. ICM did reach out to [employer] and the patient was previously screened for Medicaid; however, his income is above the Medicaid limit. ICM did make the patient aware. Patient has new medications that will need to be picked up at [facility]. MATCH approved and submitted to Pharmacy. Patient has family support at the bedside that can assist with transportation home. No further needs identified at this time.    Rx BIN: [id number] Rx Group: [id number] Rx PCN: [id number] Relationship Code: 1 Person Code: [id number]  Patient ID (MRN): [id number]    Patient Name: Herbert Barnes   Patient DOB: [birth year]   Discharge Date: 11-10-24   Expiration Date: 11-18-24 (must be filled within 7 days of discharge)     Transition of Care Asessment: Insurance and Status: Selfpay (Works however; has been unable to afford.) Patient has primary care physician: Yes Home environment has been reviewed: lives in a hotel at [facility]. Prior level of function:: independent Prior/Current Home Services: No current home services Social Drivers of Health Review: SDOH reviewed needs interventions Readmission risk has been reviewed: Yes Transition of care needs: no transition of care needs at this time

## 2024-11-10 NOTE — Discharge Instructions (Signed)
 Food Resources [employer], Insurance Claims Handler, and Tourist Information Centre Manager Phone: [phone] Website: [email or web address].[email or web address] .org Location [address] [place], [place], [place] [address] Eligibility Residents of [facility] Hours of Operation Monday- Friday (8:30am-5pm) Food Pantry: Monday- Friday (9am-2pm) Cost/Fees None  Referral Walk-In Only [employer] [address] Food Pantry Services Food Pantry, Civil Service Fast Streamer, Dealer, and Film/video Editor Information Phone: [phone] Email:[email or web address] Website: [email or web address].ch Location [address] [place], [place] [address] Eligibility Resident of [facility] Hours of Operation Monday- Thursday (8am-5pm) Friday (8am-1pm) Cost/Fees None  Referral No Registration Needed First [address] of [place]; 5 Loaves 2 [employer] Network Engineer Information Phone: [phone] Email: [email or web address] Website: [email or web address] Location [address], [place], [place] [address] Eligibility Individuals and Families in [place] Hours of Operation Saturdays 10am-11am  Cost/Fees None Referral No Registration Needed Resource Guide 17. Greater First [employer] Information Phone: [phone] Website: [email or web address]://findfood.[email or web address].[email or web address] Location [address], [place], [place] [address] Eligibility Residents of [place] and Surrounding Areas Hours of Operation 3 rd Wednesday of the Month 4pm-6pm Cost/Fees None  Referral No Registration Needed [facility] Services Food Pantry, Youth Programs, and [facility] Information Phone: [phone] Location [address] [place], [place] [address] Eligibility Need SS-card for you and everyone in your household, 2 forms of I.D, and  Proof of address Hours of Operation Monday- Thursday 9:30am-12:30pm Cost/Fees None  Referral No Registration Needed Helping [facility] [place] Services Food Pantry and Emergency Financial Assistance Contact Information Phone: [phone] Contact: [email or web address] Website: www.[email or web address] Location [address], [place], [place] [address] Eligibility Residents of [place] and Surrounding Areas Hours of Operation Tuesday & Wednesday (11am-2pm) Thursday (11am-2pm) & (3pm-6pm) Cost/Fees None  Referral No Registration Needed Resource Guide 18. Memorial [employer] Financial Pantry and Engineer, Building Services Phone: [phone] Email: [email or web address] Website: [email or web address] Location [address] [place], [place] [address] Eligibility Residents of [place] and Surrounding Areas Hours of Operation 3rd Thursday of the Month 9:30am-11am Cost/Fees None  Referral No Registration Needed [facility] Food Pantry Contact Information Phone: [phone] Location [address], [place], [place] [address] Eligibility Residents of [employer] and Surrounding Areas Hours of Operation 1st and 3rd Saturday of the Month Cost/Fees None  Referral Schedule Appointment the Monday Before [place] [employer] Food Pantry and Spiritual Support Contact Information Phone: [phone] Website: Facebook Account Location [address], [place], [place] [address] Eligibility Residents of [facility] and Surrounding Areas Hours of Operation 4 th Saturday of the Month 9am-11am Cost/Fees None  Referral No Referral Needed Resource Guide 19. Willena Blackwood of [employer] Logistics/support/administrative Officer Information Phone: [phone] Email: [email or web address]_lUohRTPkiNIcDmrMJgPJiwiQlcpEILWC$$MzfnczAzqnmzIZPI_lUohRTPkiNIcDmrMJgPJiwiQlcpEILWC$ .com Website: www.[email or web address] Location [address], [place], [place] [address] Eligibility Residents of  [facility] and Surrounding Areas Hours of Operation 1st and 3rd Monday (4:30pm-6pm) and Wednesday (8:30am-11am) Cost/Fees None  Referral No Referral Needed [employer] Food Pantry and Sport And Exercise Psychologist Information Phone: [phone] Email: [email or web address] Website: [email or web address] Location [address] [place], [place], [place] [address] Eligibility Residents of [place] and Surrounding Areas; Food Bank Card or Photo I.D. Hours of Operation 1st and 3rd Thursday (5:30pm-7pm) Cost/Fees None  Referral No Referral Needed [facility] Food Pantry Contact Information Phone: [phone] Email: [email or web address] Website: [email or web address] Location [address], [place], [place]  [address] Eligibility Residents of [facility] and Surrounding Areas; Photo I.D. and Proof of Address Hours of Operation Tuesday (9am-3pm) and Wednesday (9am-11am) Cost/Fees None  Referral No Referral Needed Resource Guide 20. BackPack Hydrologist Information Phone: [phone] Location [address], [place], [place] [address] Eligibility [facility]; Children under 18 Hours of Operation Monday (1pm-4pm) Tuesday (9:30am-12:30pm) Wednesday (12pm-4pm) Friday (9:30pm-12:30pm) Cost/Fees None  Referral Schedule Appointment [address] [employer] Food Pantry, Scientific Laboratory Technician, Emergency Museum/gallery Curator Information Phone: [phone] Email: [email or web address] Website: www.[email or web address] Location [address], [place], [place] [address] Eligibility Resident of [facility] and Surrounding Areas Hours of Operation Clothes Closet & Food Pantry Thursday 1(0am-1pm) Drive-Thru Food Service Wednesday (10:30am-11:30am) Cost/Fees None  Referral Appointment and Walk-In  Options [facility] End [facility] Services Food Pantry Contact Information Phone: [phone] Email: [email or web address] Website: [email or web address] Location [address], [place], [place] [address] Eligibility Residents of [employer] and Surrounding Areas; Proof of Address Hours of Operation Food Pantry Thursday (2pm-4pm) & Community Meal Thursday (5pm-7pm) Cost/Fees None  Referral No Referral Needed Resource Guide 21. Out of the [employer] and Event Organiser Information Phone: [phone] Email: [email or web address] Website: outofthegardenproject.org Location [address], [place], [place] [address] Eligibility Families in [place] Hours of Operation Schedule is on Website  Cost/Fees None Referral Schedule Service Online The [employer] of the [employer] Food Pantry Contact Information Phone: [phone] Email: [email or web address] Website: Facebook & findfood.[email or web address] Location [address], [place], [place], [place]  [address] Eligibility Residents of [employer] of Operation TBD Cost/Fees None  Referral Appointment Only [employer] Financial Table Occupational Hygienist Information Phone: [phone] Email: [email or web address] Website: www.theblessedtable.org Location [address] [place], [place] [address] Eligibility Residents of [employer] of Operation Tuesday- Friday (10am-1pm) Cost/Fees None Referral Call or [facility] Guide 22. [facility] Food Pantry Contact Information Phone: [phone] Email: [email or web address] Website: [email or web address] Location [address], [place], [place] [address] Eligibility Residents of [facility] Hours of Operation Tuesday and Thursday (10am-2pm) Cost/Fees None  Referral No Referral Needed [facility] Person Information Phone: [phone] Location [address] [place], [place] [address] Eligibility Residents of [place] and Surrounding Areas Hours of Operation Monday, Wednesday, Thursday, and Friday (10am-2pm) Cost/Fees None  Referral No Referral Needed [employer] Information Phone: [phone] Email: [email or web address] Website: [email or web address].[email or web address] .org Location [address] [place], [place] [address] Eligibility Residents of [facility] Hours of Operation Every Saturday 6pm  Cost/Fees None Referral No Referral Needed Resource Guide 23. Family Dollar Stores Information Phone: [phone] Website: findfood.ghpfa.org Location [address] [place], [place], [place] [address] Eligibility Residents of [employer] of Operation Wednesdays 6pm-Untill  Cost/Fees None Referral No Referral Needed [facility] Dish and [employer]; Food Drive-By and Meals on [employer] Information Phone: MAIN [phone] HD&H [phone] MOW [phone] Website: www.fpcgreensboro.org Location [address] [place], [place] [address] Eligibility Residents of [facility] and Surrounding Areas Hours of Operation HD&H (Tuesday and Thursday) Food Drive-By (Wednesday 88-8) MOW (3rd Wednesday of Month and 5th Mondays of Year) Cost/Fees None  Referral No Referral Needed Free Indeed Retail Banker Phone: [phone] Email: [email or web address] Website: www.freeiom.org Location [address] [place], [place]  [address] Eligibility Residents of [facility] and Surrounding Areas Hours of Operation Every 3rd Saturday 10am-12pm Cost/Fees None  Referral No Referral Needed Resource Guide 24. One Step Further Merchandiser, Retail and Nutrition   Support Services Nutrition Education Programming, Monthly Health Screenings, Grocery Assistance and Home Delivery Contact Information Phone: [address] [phone] [place] [phone] Website: www.onestepfurther.com Location [address], [place], [place] [address] & [address] [place], [place] [address] Eligibility Residents of [place] Hours of Operation [place] Online Order Pick Up Monday, Tuesday and Thursday (9:30am-2:15pm)  In Person Shopping Wednesday (12pm-6pm) Friday (11am-2pm) Saturday (9am-12pm) [place] Online Order Pick Up and [employer]ul 2nd & 3rd Fridays of Each Month from (11am-2pm) Cost/Fees None  Referral No Referral Needed [facility] Services Food Pantry Contact Information Phone: [phone] Email: info@friendlyave [email or web address] Website: friendly printingmaps.se ave/pantry Location [address], [place], [place] [address] Eligibility Residents of [facility] Hours of Operation TBD Cost/Fees None  Referral By Appointment Only [place] the [employer] Pantry Contact Information Phone: [phone] Website: reservationslist.[email or web address] Location [address] [place] [address] [place], [place] [address] Eligibility [place] Community Members Hours of Operation Monday- Friday (10am-1pm)  Cost/Fees None Referral No Referral Needed  Shelter/Housing Resources  Family Services of the [place] [facility] [place] [employer] Temporary Emergency Shelter and Radiographer, Therapeutic for individuals and families escaping domestic  violence. Contact Information Phone: [phone] Email: [email or web address]@[email or web address] [email or web address] Location Confidential Location in [place], [place]. Eligibility Services are available to individuals and families fleeing domestic violence situations. Hours of Operation 24/7 Crisis Support  Cost/Fees None Referral Appointment/Walk-in. Call for intake and eligibility  screening  [facility] Emergency Family Shelter Services Temporary Emergency Shelter, Case Management, Job Readiness Support, and Housing Assistance Contact Information Phone: [phone] Location [address] [place], [place], [place] [address] Eligibility Families with children experiencing homelessness. No single individuals. Hours of Operation 24/7 for Residents. Office Hours Monday-Friday 9am-5pm Cost/Fees None  Referral Recomneded to call for availability and intake process  Proofreader, Programme Researcher, Broadcasting/film/video, Housing Assistance, Engineer, Water, and [employer] Information Phone: [phone] Location [address] [place], [place], [place] [address] Eligibility Individuals and families experiencing homelessness or financial hardship Hours of Operation Office: Monday- Friday 8am-5pm Food Pantry: Monday- Friday 9:30am-3:30pm  Shelter: 24/7 for Residents Cost/Fees None  Referral Appointment and Walk-In Options  [facility]th Focus - Act together Emergency Shelter Services Housing and Supportive Services for [employer] Information Phone: [phone] Email: [email or web address] [email or web address] Location [address] [place], [place] [address] Eligibility Services are available to youth aged 71 to 71 who are experiencing homelessness, have run away, are in family crises,  or are victims of abuse or neglect. Hours of Operation Shelter Services: 24/7 Office Hours: Monday- Friday 9am-5pm Cost/Fees None  Referral Referrals can be made 24 hours a day by calling the shelter directly. Youth may self-refer or be referred by parents,  guardians, schools, patent examiner, or other agencies.  Servant [employer] for [employer] Phone: [phone] Fax: [phone] Email: [email or web address] Location [address] [place], [place] [address] Eligibility Services are  specifically designed for veterans experiencing homelessness who have significant disabilities, including  severe or chronic medical and mental health conditions. Hours of Operation 24 hours for residents Office: Monday- Friday 9am-5pm Cost/Fees None  Referral Individuals can inquire about services by contacting [facility] directly via phone or email. For coordinated  entry into homelessness services in [facility], individuals  may also contact [facility] at [phone] or email [email or web address] [email or web address]. Warmer, Drier, Safer: Designer, Television/film Set Information Phone: [phone] Fax: [phone] Location Physical Address: [address], [place], [place] [address]  Mailing Address: [address], [place], [place] [address] Eligibility Services are available to low-income homeowners in [place] who are unable to afford necessary home  repairs. Hours of Operation Monday- Friday 8:30am-4:30pm Cost/Fees TBD Referral Individuals can inquire about services by contacting [employer] directly via phone or through the  contact form on their website.  Interactive Resource Center [facility]) Services Case Management, Mental Health Support, Employment Assistance, and Housing Resources Contact Information Phone: [phone] Email: info@ircgso .org Location Physical Address: [address] [place], [place] [address]  Mailing Address: [address] [address], [place], [place] [address] Eligibility Services are available to individuals currently facing, experiencing, or transitioning out of homelessness. Hours of Operation Monday- Friday 8am-3pm  Cost/Fees None Referral Individuals can self-refer by visiting the [facility] during service hours. Community partners and service providers are  encouraged to coordinate with the [facility] Interim Librarian, Academic for  service provision

## 2024-11-10 NOTE — Discharge Summary (Cosign Needed)
 Discharge Summary   Patient ID: [name] MRN: [id number]; DOB: Jul 12, 1976  Admit date: 11/09/2024 Discharge date: 11/11/2024  PCP:  Tanda Bleacher, MD   [facility] Providers Cardiologist:  Alm Clay, MD     Discharge Diagnoses  Principal Problem:   ST elevation myocardial infarction (STEMI) of inferior wall, initial episode of care [facility]) Active Problems:   Type 2 diabetes mellitus with complication, without long-term current use of insulin  (HCC)   Hyperlipidemia Ischemic cardiomyopathy with mildly reduced LVEF Tobacco use disorder, patient highly motivated for quitting smoking.  14 mg nicotine  patches sent.  Diagnostic Studies/Procedures   Diagnostic: Dominance: Right                                                        Inferior STEMI with 100% mid RCA occlusion followed by eccentric 40% mid to distal and 50% distal RCA disease =>  Successful revascularization with Onyx Frontier DES 2.5 mm x 34 mm postdilated to 3.1 mm-restoring TIMI-3 flow. No complications. Left Coronary Artery with minimal disease-50% mid LAD at diminutive 2nd Diag. Is also diminutive apical to 3rd Diag branch; large bifurcating RI, and relative normal LCx which gives rise to a small OM1, terminates as a major OM 2 with small AVG LCx and LPL 1 Low normal to mildly reduced LVEF of roughly 45 to 50% with basal to mid inferior hypokinesis. Normal LVEDP    RECOMMENDATIONS   In the absence of any other complications or medical issues, we expect the patient to be ready for discharge from an interventional cardiology perspective on 11/10/2024.   Would need did have time to potentially titrate medications which would be the 1 reason to keep him another day.  If blood pressure is too low to initiate GDMT at this point besides DAPT and high-dose statin. => Could consider low-dose ARB prior to discharge   Recommend dual antiplatelet therapy with Aspirin  81mg  daily and Prasugrel  10mg  daily  long-term (beyond 12 months) because of 30+ millimeter stent in the RCA with moderate distal disease.  ECHOCARDIOGRAM COMPLETE 11/10/2024  1. Left ventricular ejection fraction, by estimation, is 45 to 50%. The left ventricle has mildly decreased function. The left ventricle demonstrates regional wall motion abnormalities (see scoring diagram/findings for description). There is mild concentric left ventricular hypertrophy. Left ventricular diastolic parameters were normal. 2. Right ventricular systolic function is normal. The right ventricular size is normal.    History of Present Illness   Herbert Barnes is a 48 y.o. male with DM and tobacco use who presented with a STEMI. Reported a family hx of mother having an MI in her 33s. He has ha several ED visits for various things over the past couple of months. He is followed by the [facility], last seen 08/2024 and restarted on metformin . Notes mention non-compliance issues.    Presented to the ED with complaints of chest pain. Initially reported shortness of breath intermittent for the past couple of weeks. The evening prior to presentation to the ED he developed left arm pain and then chest pain this morning prompting him to come the ED.    In the ED EKG showed sinus rhythm with inferior ST elevation, and CODE STEMI called. He was brought directly to the cath lab for emergent cardiac catheterization.  Labs on admission Na+ 140, K+ 4.1, Cr 1.0, WBC 6.1, Hgb 13.9, lactic acid 1.5. Received 324 mg ASA and IV heparin  prior to transfer to the cath lab.    Hospital Course    Inferior STEMI -- Developed left arm pain in the evening prior to admission and then chest pain the day of presentation.  EKG showed subtle ST elevation in inferior leads and code STEMI was called.  Underwent cardiac catheterization noted above with 100% mid RCA occlusion followed by eccentric 40% mid to distal and 50% distal RCA disease treated with  PCI/DES x 1.  50% mid LAD at second diagonal to be treated medically. -- LV gram on cath with LVEF of 45 to 50% with basal to mid inferior hypokinesis -- Recommendations for DAPT with aspirin /Effient  long-term if tolerated given 30+ mm of stent in the RCA  ICM -- LV gram with LVEF of 45 to 50%, basal to mid inferior hypokinesis -- Formal echocardiogram pending at the time of discharge -- GDMT: Metoprolol  XL 25 mg daily, losartan  25 mg daily, Jardiance  10 mg daily  Hyperlipidemia -- LDL 135, HDL 36, LP(a) pending -- Continue atorvastatin  80 mg daily -- Will need LFT/FLP's in 8 weeks  Diabetes -- Hemoglobin A1c 7.2 -- Continue home metformin , Jardiance  10 mg daily added  Tobacco use disorder --Patient smoking about 1 pack of cigarettes a day but states that he has quit as of this hospital admission.  Will prescribe nicotine  patches 14 mg for 1 month and if needed he can be prescribed 7 mg patches at follow-up otherwise patient states that he will wean this patch within the next few days.  Did the patient have an acute coronary syndrome (MI, NSTEMI, STEMI, etc) this admission?:  Yes                               AHA/ACC ACS Clinical Performance & Quality Measures: Aspirin  prescribed? - Yes ADP Receptor Inhibitor (Plavix/Clopidogrel, Brilinta/Ticagrelor or Effient /Prasugrel ) prescribed (includes medically managed patients)? - Yes Beta Blocker prescribed? - Yes High Intensity Statin (Lipitor 40-80mg  or Crestor 20-40mg ) prescribed? - Yes EF assessed during THIS hospitalization? - Yes For EF <40%, was ACEI/ARB prescribed? - Not Applicable (EF >/= 40%) For EF <40%, Aldosterone Antagonist (Spironolactone or Eplerenone) prescribed? - Not Applicable (EF >/= 40%) Cardiac Rehab Phase II ordered (including medically managed patients)? - Yes  The patient will be scheduled for a TOC follow up appointment in 10-14 days.  A message has been sent to the [facility] and Scheduling Pool at the office where  the patient should be seen for follow up.  _____________  Discharge Vitals Blood pressure 124/77, pulse 86, temperature 99 F (37.2 C), temperature source Oral, resp. rate 16, height 6' 1 (1.854 m), weight 93.9 kg, SpO2 98%.  Filed Weights   11/09/24 1313 11/09/24 1530  Weight: 93.9 kg 93.9 kg    Labs & Radiologic Studies  CBC Recent Labs    11/09/24 1317 11/09/24 1407 11/10/24 1029  WBC 6.1  --  6.5  HGB 13.9 15.3 12.9*  HCT 42.7 45.0 38.3*  MCV 83.7  --  82.7  PLT 278  --  245   Basic Metabolic Panel Recent Labs    87/83/74 1317 11/09/24 1407 11/10/24 1029  NA 137 140 133*  K 4.4 4.1 3.9  CL 104 105 102  CO2 24  --  22  GLUCOSE 125* 125* 158*  BUN  15 16 12   CREATININE 1.03 1.00 0.91  CALCIUM  9.6  --  9.1   Liver Function Tests Recent Labs    11/09/24 1317  AST 59*  ALT 18  ALKPHOS 71  BILITOT 0.4  PROT 7.4  ALBUMIN 4.4    Recent Labs  Lab 11/09/24 1317 11/09/24 1908  TRNPT 172* 1,648*    Hemoglobin A1C Recent Labs    11/10/24 1029  HGBA1C 7.2*   Fasting Lipid Panel Recent Labs    11/09/24 1424  CHOL 198  HDL 36*  LDLCALC 135*  TRIG 132  CHOLHDL 5.5   Lipoprotein (a)  Date/Time Value Ref Range Status  11/10/2024 03:48 AM 142.0 (H) <75.0 nmol/L Final    Comment:    (NOTE) This test was developed and its performance characteristics determined by [facility]. It has not been cleared or approved by the Food and Drug Administration. Note:  Values greater than or equal to 75.0 nmol/L may       indicate an independent risk factor for CHD,       but must be evaluated with caution when applied       to non-Caucasian populations due to the       influence of genetic factors on Lp(a) across       ethnicities. Performed At: [facility] [address] [place], [place] [id number] Jennette Shorter MD Ey:[id number]     Thyroid Function Tests Recent Labs    11/10/24 1029  TSH 1.040   Disposition Pt is being discharged home today  in good condition.  Follow-up Plans & Appointments  Follow-up Information     Jaycee Greig PARAS, NP Follow up.   Specialty: Nurse Practitioner Why: TIME : 9:20 AM DATE : JANUARY 21 , 2026 WEDNESDAY  PLEASE BRING ALL CURRENT MEDICATION,ID and INS CARD, CO-PAY Contact information: [address] [place] [address] [phone]                Discharge Instructions     AMB referral to Phase II Cardiac Rehabilitation   Complete by: As directed    Diagnosis:  STEMI Coronary Stents     After initial evaluation and assessments completed: Virtual Based Care may be provided alone or in conjunction with Phase 2 Cardiac Rehab based on patient barriers.: Yes   Intensive Cardiac Rehabilitation (ICR) [facility] location only OR Traditional Cardiac Rehabilitation (TCR) *If criteria for ICR are not met will enroll in TCR [facility] only): Yes   Call MD for:  difficulty breathing, headache or visual disturbances   Complete by: As directed    Call MD for:  persistant dizziness or light-headedness   Complete by: As directed    Call MD for:  redness, tenderness, or signs of infection (pain, swelling, redness, odor or green/yellow discharge around incision site)   Complete by: As directed    Discharge instructions   Complete by: As directed    Radial Site Care Refer to this sheet in the next few weeks. These instructions provide you with information on caring for yourself after your procedure. Your caregiver may also give you more specific instructions. Your treatment has been planned according to current medical practices, but problems sometimes occur. Call your caregiver if you have any problems or questions after your procedure. HOME CARE INSTRUCTIONS You may shower the day after the procedure. Remove the bandage (dressing) and gently wash the site with plain soap and water . Gently pat the site dry.  Do not apply powder or lotion  to the site.  Do not submerge the affected site in water  for  3 to 5 days.  Inspect the site at least twice daily.  Do not flex or bend the affected arm for 24 hours.  No lifting over 5 pounds (2.3 kg) for 5 days after your procedure.  Do not drive home if you are discharged the same day of the procedure. Have someone else drive you.  You may drive 24 hours after the procedure unless otherwise instructed by your caregiver.  What to expect: Any bruising will usually fade within 1 to 2 weeks.  Blood that collects in the tissue (hematoma) may be painful to the touch. It should usually decrease in size and tenderness within 1 to 2 weeks.  SEEK IMMEDIATE MEDICAL CARE IF: You have unusual pain at the radial site.  You have redness, warmth, swelling, or pain at the radial site.  You have drainage (other than a small amount of blood on the dressing).  You have chills.  You have a fever or persistent symptoms for more than 72 hours.  You have a fever and your symptoms suddenly get worse.  Your arm becomes pale, cool, tingly, or numb.  You have heavy bleeding from the site. Hold pressure on the site.   PLEASE DO NOT MISS ANY DOSES OF YOUR EFFIENT !!!!! Also keep a log of you blood pressures and bring back to your follow up appt. Please call the office with any questions.   Patients taking blood thinners should generally stay away from medicines like ibuprofen , Advil , Motrin , naproxen , and Aleve  due to risk of stomach bleeding. You may take Tylenol  as directed or talk to your primary doctor about alternatives.  PLEASE ENSURE THAT YOU DO NOT RUN OUT OF YOUR EFFIENT . This medication is very important to remain on for at least one year. IF you have issues obtaining this medication due to cost please CALL the office 3-5 business days prior to running out in order to prevent missing doses of this medication.   Increase activity slowly   Complete by: As directed        Discharge Medications Allergies as of 11/11/2024       Reactions   Codeine Other (See  Comments)   threw my head for a loop        Medication List     STOP taking these medications    amoxicillin -clavulanate 875-125 MG tablet Commonly known as: AUGMENTIN    aspirin -acetaminophen -caffeine 250-250-65 MG tablet Commonly known as: EXCEDRIN MIGRAINE       TAKE these medications    aspirin  81 MG chewable tablet Chew 1 tablet (81 mg total) by mouth daily.   atorvastatin  80 MG tablet Commonly known as: LIPITOR Take 1 tablet (80 mg total) by mouth daily.   empagliflozin  10 MG Tabs tablet Commonly known as: Jardiance  Take 1 tablet (10 mg total) by mouth daily.   fluticasone  50 MCG/ACT nasal spray Commonly known as: FLONASE  Place 2 sprays into both nostrils daily. What changed:  when to take this reasons to take this   losartan  25 MG tablet Commonly known as: Cozaar  Take 1 tablet (25 mg total) by mouth every evening.   metFORMIN  500 MG 24 hr tablet Commonly known as: GLUCOPHAGE -XR Take 2 tablets (1,000 mg total) by mouth 2 (two) times daily with a meal.   metoprolol  succinate 25 MG 24 hr tablet Commonly known as: TOPROL -XL Take 1 tablet (25 mg total) by mouth daily. Take with or immediately following a  meal.   nicotine  14 mg/24hr patch Commonly known as: NICODERM CQ  - dosed in mg/24 hours Place 1 patch (14 mg total) onto the skin daily.   nitroGLYCERIN  0.4 MG SL tablet Commonly known as: NITROSTAT  Place 1 tablet (0.4 mg total) under the tongue every 5 (five) minutes as needed for chest pain.   prasugrel  10 MG Tabs tablet Commonly known as: EFFIENT  Take 1 tablet (10 mg total) by mouth daily.   True Metrix Blood Glucose Test test strip Generic drug: glucose blood Use to check blood sugar 3 times daily.   True Metrix Meter w/Device Kit Use to check blood sugar 3 times daily.   TRUEplus Lancets 28G Misc Use to check blood sugar 3 times daily.         Outstanding Labs/Studies FLP/LFTs in 8 weeks BMET at follow up  Duration of Discharge  Encounter: APP Time: 35 minutes   Signed, Gordy Bergamo, MD 11/11/2024, 9:03 AM

## 2024-11-10 NOTE — Progress Notes (Signed)
° ° °  DC order signed but patient had chest pain shortly thereafter. Reports a sharp shooting pain that was brief across the chest and quickly resolved. Continues to have left shoulder pain with left hand pain but improved with tylenol . EKG stable with no worsening ST changes. Will cancel DC order and observe overnight. Discussed with attending regarding canceled DC.   SignedManuelita Rummer, NP-C 11/10/2024, 5:03 PM Pager: [phone]

## 2024-11-11 ENCOUNTER — Other Ambulatory Visit (HOSPITAL_COMMUNITY): Payer: Self-pay

## 2024-11-11 LAB — LIPOPROTEIN A (LPA): Lipoprotein (a): 142 nmol/L — ABNORMAL HIGH (ref <75.0)

## 2024-11-11 MED ORDER — NICOTINE 14 MG/24HR TD PT24
14.0000 mg | MEDICATED_PATCH | TRANSDERMAL | 0 refills | Status: AC
  Filled 2024-11-11: qty 30, 30d supply, fill #0

## 2024-11-11 MED ORDER — GUAIFENESIN-DM 100-10 MG/5ML PO SYRP: 5.0000 mL | ORAL_SOLUTION | ORAL | Status: DC | PRN

## 2024-11-11 NOTE — Plan of Care (Signed)
°  Problem: Education: Goal: Knowledge of General Education information will improve Description: Including pain rating scale, medication(s)/side effects and non-pharmacologic comfort measures Outcome: Progressing   Problem: Elimination: Goal: Will not experience complications related to urinary retention Outcome: Progressing   Problem: Skin Integrity: Goal: Risk for impaired skin integrity will decrease Outcome: Progressing   Problem: Activity: Goal: Ability to return to baseline activity level will improve Outcome: Progressing   Problem: Cardiovascular: Goal: Vascular access site(s) Level 0-1 will be maintained Outcome: Progressing   Problem: Health Behavior/Discharge Planning: Goal: Ability to safely manage health-related needs after discharge will improve Outcome: Progressing

## 2024-11-12 ENCOUNTER — Telehealth: Payer: Self-pay

## 2024-11-12 NOTE — Telephone Encounter (Signed)
 FIRST ATTEMPT: Attempted to reach patient with no answer. No voicemail available. Will try again at a later time.

## 2024-11-12 NOTE — Transitions of Care (Post Inpatient/ED Visit) (Unsigned)
" ° °  11/12/2024  Name: Herbert Barnes MRN: [id number] DOB: 1976-07-12  Today's TOC FU Call Status: Today's TOC FU Call Status:: Unsuccessful Call (1st Attempt) Unsuccessful Call (1st Attempt) Date: 11/12/24  Attempted to reach the patient regarding the most recent Inpatient/ED visit.  Follow Up Plan: Additional outreach attempts will be made to reach the patient to complete the Transitions of Care (Post Inpatient/ED visit) call.   Signature Julian Lemmings, LPN [facility] Nurse Health Advisor Direct Dial [phone]  "

## 2024-11-15 ENCOUNTER — Encounter: Payer: Self-pay | Admitting: Cardiology

## 2024-11-15 NOTE — Telephone Encounter (Signed)
 SECOND ATTEMPT: Attempted to reach patient with no answer. No voicemail available. Will try again at a later time.

## 2024-11-15 NOTE — Transitions of Care (Post Inpatient/ED Visit) (Unsigned)
" ° °  11/15/2024  Name: Herbert Barnes MRN: [id number] DOB: Sep 25, 1976  Today's TOC FU Call Status: Today's TOC FU Call Status:: Unsuccessful Call (2nd Attempt) Unsuccessful Call (1st Attempt) Date: 11/12/24 Unsuccessful Call (2nd Attempt) Date: 11/15/24  Attempted to reach the patient regarding the most recent Inpatient/ED visit.  Follow Up Plan: Additional outreach attempts will be made to reach the patient to complete the Transitions of Care (Post Inpatient/ED visit) call.   Signature Julian Lemmings, LPN [facility] Nurse Health Advisor Direct Dial [phone]  "

## 2024-11-16 ENCOUNTER — Encounter (HOSPITAL_COMMUNITY): Payer: Self-pay

## 2024-11-16 ENCOUNTER — Telehealth (HOSPITAL_COMMUNITY): Payer: Self-pay

## 2024-11-16 NOTE — Telephone Encounter (Signed)
 Attempted to call patient in regards to Cardiac Rehab - Unable to leave VM, VM box is not setup.   Sent letter

## 2024-11-16 NOTE — Telephone Encounter (Signed)
 Patient contacted by clinical team regarding discharge from hospital on 11/11/2024.  Patient understands to follow up with provider Thom Sluder on 11/23/2024 at 11:30 am at [facility] office. Patient understands discharge instructions?Yes  Patient understands medications and how to take them? Yes Patient understands to bring all medications to this visit? Yes  You can review AVS Prior to calling to determine if patient had orders for Home Health or Medical Equipment (DME). Patient reports appropriate help at home with ADL's and has DME at this time. None ordered Patient reports they have been contacted by home health if ordered.  No   You can see if the patient is active in MyChart next to their picture.  If they are please remind them that they can do early check in for their visit a day or two before to make the day of their visit run as smooth as possible.  If they need help with MyChart you can refer them to their AVS for user name and password and give them the phone number [phone] to call for help.     Postop: Structural/Cath/Device/(other cardiac invasive procedures) patients:   What is your wound status? Any signs/ symptoms of infection (Temp, redness/ red streaks, swelling, purulent drainage, foul odor or smell)?None reported   Please do not place any creams/ lotions/ or antibiotic ointment on any surgical incisions/ wounds without physician approval.           Patient advised

## 2024-11-16 NOTE — Transitions of Care (Post Inpatient/ED Visit) (Signed)
" ° °  11/16/2024  Name: Herbert Barnes MRN: [id number] DOB: 12-02-1975  Today's TOC FU Call Status: Today's TOC FU Call Status:: Unsuccessful Call (3rd Attempt) Unsuccessful Call (1st Attempt) Date: 11/12/24 Unsuccessful Call (2nd Attempt) Date: 11/15/24 Unsuccessful Call (3rd Attempt) Date: 11/16/24  Attempted to reach the patient regarding the most recent Inpatient/ED visit.  Follow Up Plan: No further outreach attempts will be made at this time. We have been unable to contact the patient.  Signature Julian Lemmings, LPN [facility] Nurse Health Advisor Direct Dial [phone]  "

## 2024-11-20 NOTE — Progress Notes (Unsigned)
 " Cardiology Office Note:  .   Date:  11/23/2024  ID:  Viktoria LITTIE Birmingham, DOB 18-Dec-1975, MRN [id number] PCP: Tanda Bleacher, MD  [facility] Providers Cardiologist:  Alm Clay, MD {  History of Present Illness: .   Herbert Barnes is a 48 y.o. male with history of CAD with recent STEMI DES to RCA 10/2024, ICM with mildly reduced EF, hyperlipidemia, diabetes, tobacco use.     CAD 10/2024 admission with inferior STEMI.  Reported chest pain, SOB, left arm pain.  LHC with 100% mid RCA occlusion and 40 to 50% distal RCA disease.  Status post DES x 1.  50% and LAD at D2 treated medically.  EF 45 to 50%.  Over 30+ millimeters stent, recommended greater than 12 months DAPT.  Social history  Mother died of MI in her 74s Currently smoking 4 to 5 cigarettes/day. 1 drink every other weekend. No drug use.     Patient previously with no cardiac history.  Recently admitted 10/2024 for inferior STEMI with stent placed RCA.  EF 45 to 50%.  Today patient presents for hospital follow-up.  He reports 100% compliancy with all of his medications and no missed doses.  He has been working hard to reduce his smoking and down to 4 to 5 cigarettes/day.  Previously 1.5 packs/day.  Has not had any significant episodes of chest pain since his admission.  He said he has some brief muscle spasms on his right side of his arm but no persistent pain.  After cutting back his cigarette smoking he actually thinks his shortness of breath has improved.  Has not had any peripheral edema, orthopnea.  Weights have been stable.  He does report that he is financially limited and living off of $500 per week.  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  ROS: Denies: Chest pain, shortness of breath, orthopnea, peripheral edema, palpitations, decreased exercise intolerance, fatigue, lightheadedness.   Studies Reviewed: SABRA    EKG  Interpretation Date/Time:  Tuesday November 23 2024 11:39:55 EST Ventricular Rate:  83 PR Interval:  164 QRS Duration:  80 QT Interval:  372 QTC Calculation: 437 R Axis:   -30  Text Interpretation: Normal sinus rhythm Left axis deviation Possible Inferior infarct (cited on or before 09-Nov-2024) When compared with ECG of 10-Nov-2024 16:28, inferior T waves noted still. Confirmed by Darryle Currier [id number]) on 11/23/2024 11:47:07 AM    Risk Assessment/Calculations:           Physical Exam:   VS:  BP 126/78   Pulse 83   Ht 6' 1 (1.854 m)   Wt 208 lb 9.6 oz (94.6 kg)   SpO2 97%   BMI 27.52 kg/m    Wt Readings from Last 3 Encounters:  11/23/24 208 lb 9.6 oz (94.6 kg)  11/09/24 207 lb 0.2 oz (93.9 kg)  10/07/24 207 lb (93.9 kg)    GEN: Well nourished, well developed in no acute distress NECK: No JVD; No carotid bruits CARDIAC: RRR, no murmurs, rubs, gallops RESPIRATORY:  Clear to auscultation without rales, wheezing or rhonchi  ABDOMEN: Soft, non-tender, non-distended EXTREMITIES:  No edema; No deformity   ASSESSMENT AND PLAN: .    CAD Hyperlipidemia - 10/2024 inferior STEMI with DES to RCA. 50% mid LAD at second diagonal to be treated medically.  He is doing well after his STEMI, compliant with all of his medications.  Tolerating DAPT without any  bleeding issues.  EKG continues to show inferior T wave inversions. No CP. Continue with DAPT therapy with aspirin  and Effient  for greater than 12 months given over 30+ millimeter stent.   Continue atorvastatin  80 mg, Toprol -XL 25 mg. LDL 135.  Repeat LFT/lipid panel in 8 weeks.   Right radial cath site free of any acute complications.  ICM with mildly reduced EF -10/2024 EF 45 to 50%.  Normal RV.  No significant valve disease. NYHA class I symptoms, euvolemic on exam.  Works a very physically demanding job and doing so without any limitations.  Since quitting smoking he feels that his shortness of breath actually has improved. GDMT:  Continue Toprol  XL 25 mg, losartan  25 mg, Jardiance  10 mg.  Not titrating any GDMT further at this time given concerns for financial challenges.  Will send a message to our pharmacy team to help see if he qualifies for any patient assistance. Hopefully can switch to entresto.  Repeat BMP today.  Nicotine  dependence Smoking cessation encouraged and he is making progress.  Previously was smoking 1.5 pack/day.  Down to 4 to 5 cigarettes.  He is going to be starting his nicotine  patches.  Diabetes A1c 7.2%.    Cardiac Rehabilitation Eligibility Assessment  The patient is ready to start cardiac rehabilitation from a cardiac standpoint.       Dispo: 2-3 months after echo with Dr. Anner.   Signed, Thom LITTIE Sluder, PA-C  "

## 2024-11-23 ENCOUNTER — Telehealth: Payer: Self-pay | Admitting: Pharmacy Technician

## 2024-11-23 ENCOUNTER — Ambulatory Visit: Payer: Self-pay | Attending: Cardiology | Admitting: Cardiology

## 2024-11-23 ENCOUNTER — Encounter: Payer: Self-pay | Admitting: Cardiology

## 2024-11-23 ENCOUNTER — Other Ambulatory Visit (HOSPITAL_COMMUNITY): Payer: Self-pay

## 2024-11-23 VITALS — BP 126/78 | HR 83 | Ht 73.0 in | Wt 208.6 lb

## 2024-11-23 DIAGNOSIS — I255 Ischemic cardiomyopathy: Secondary | ICD-10-CM

## 2024-11-23 DIAGNOSIS — I2119 ST elevation (STEMI) myocardial infarction involving other coronary artery of inferior wall: Secondary | ICD-10-CM

## 2024-11-23 LAB — BASIC METABOLIC PANEL WITH GFR
BUN/Creatinine Ratio: 14 (ref 9–20)
BUN: 16 mg/dL (ref 6–24)
CO2: 21 mmol/L (ref 20–29)
Calcium: 9.8 mg/dL (ref 8.7–10.2)
Chloride: 100 mmol/L (ref 96–106)
Creatinine, Ser: 1.17 mg/dL (ref 0.76–1.27)
Glucose: 140 mg/dL — ABNORMAL HIGH (ref 70–99)
Potassium: 4.4 mmol/L (ref 3.5–5.2)
Sodium: 138 mmol/L (ref 134–144)
eGFR: 77 mL/min/1.73

## 2024-11-23 LAB — HEPATIC FUNCTION PANEL
ALT: 20 IU/L (ref 0–44)
AST: 15 IU/L (ref 0–40)
Albumin: 4.8 g/dL (ref 4.1–5.1)
Alkaline Phosphatase: 81 IU/L (ref 47–123)
Bilirubin Total: 0.5 mg/dL (ref 0.0–1.2)
Bilirubin, Direct: 0.14 mg/dL (ref 0.00–0.40)
Total Protein: 7.7 g/dL (ref 6.0–8.5)

## 2024-11-23 LAB — LIPID PANEL
Chol/HDL Ratio: 4.2 ratio (ref 0.0–5.0)
Cholesterol, Total: 147 mg/dL (ref 100–199)
HDL: 35 mg/dL — ABNORMAL LOW
LDL Chol Calc (NIH): 91 mg/dL (ref 0–99)
Triglycerides: 116 mg/dL (ref 0–149)
VLDL Cholesterol Cal: 21 mg/dL (ref 5–40)

## 2024-11-23 NOTE — Telephone Encounter (Signed)
" ° °  He does not have insurance. He received effient , nitroglycerin , metoprolol , losartan , jardiance , atorvastatin , aspirin  on 11/11/24 under the free one time only from discharge at [facility]. I called the patient but unable to leave a message. He may be eligible for assistance. I will discuss once I reach him.     "

## 2024-11-23 NOTE — Patient Instructions (Signed)
 Medication Instructions:  Your physician recommends that you continue on your current medications as directed. Please refer to the Current Medication list given to you today.  *If you need a refill on your cardiac medications before your next appointment, please call your pharmacy*  Lab Work: LFT, Lipid Panel in 2 months If you have labs (blood work) drawn today and your tests are completely normal, you will receive your results only by: MyChart Message (if you have MyChart) OR A paper copy in the mail If you have any lab test that is abnormal or we need to change your treatment, we will call you to review the results.  Testing/Procedures: Echocardiogram in 2-3 months. Your physician has requested that you have an echocardiogram. Echocardiography is a painless test that uses sound waves to create images of your heart. It provides your doctor with information about the size and shape of your heart and how well your hearts chambers and valves are working. This procedure takes approximately one hour. There are no restrictions for this procedure. Please do NOT wear cologne, perfume, aftershave, or lotions (deodorant is allowed). Please arrive 15 minutes prior to your appointment time.  Please note: We ask at that you not bring children with you during ultrasound (echo/ vascular) testing. Due to room size and safety concerns, children are not allowed in the ultrasound rooms during exams. Our front office staff cannot provide observation of children in our lobby area while testing is being conducted. An adult accompanying a patient to their appointment will only be allowed in the ultrasound room at the discretion of the ultrasound technician under special circumstances. We apologize for any inconvenience.   Follow-Up: At [facility], you and your health needs are our priority.  As part of our continuing mission to provide you with exceptional heart care, our providers are all part of one  team.  This team includes your primary Cardiologist (physician) and Advanced Practice Providers or APPs (Physician Assistants and Nurse Practitioners) who all work together to provide you with the care you need, when you need it.  Your next appointment:   3 month(s) after the echocardiogram.  Provider:   Alm Clay, MD    We recommend signing up for the patient portal called MyChart.  Sign up information is provided on this After Visit Summary.  MyChart is used to connect with patients for Virtual Visits (Telemedicine).  Patients are able to view lab/test results, encounter notes, upcoming appointments, etc.  Non-urgent messages can be sent to your provider as well.   To learn more about what you can do with MyChart, go to [email or web address].   Other Instructions None

## 2024-11-24 ENCOUNTER — Ambulatory Visit: Payer: Self-pay | Admitting: Cardiology

## 2024-11-24 DIAGNOSIS — I2119 ST elevation (STEMI) myocardial infarction involving other coronary artery of inferior wall: Secondary | ICD-10-CM

## 2024-11-24 NOTE — Progress Notes (Signed)
 Called patient, NA, no VM set up, no message left.

## 2024-11-24 NOTE — Telephone Encounter (Signed)
 Tried to call patient and vm not set up  Sent mychart

## 2024-11-26 NOTE — Telephone Encounter (Signed)
 Caller Jolynn) returned staff call regarding results.

## 2024-11-26 NOTE — Telephone Encounter (Signed)
 Tried to call patient and vm not set up   Patient seen mychart but I haven't heard back

## 2024-12-08 NOTE — Progress Notes (Signed)
 "   S:    Chief Complaint  Patient presents with   Diabetes   49 y.o. male who presents for diabetes evaluation, education, and management. Patient arrives in good spirits and presents without  any assistance.   Patient was referred and last seen by Dr. Tanda, on 09/01/2024. I saw him on 09/28/24 and changed his metformin  to XR due to GI intolerability. Last A1c on 11/10/24 of 7.2%, decreased from 9.6%.   Of note, the patient was hospitalized 11/09/24-11/11/24 for STEMI and was revascularized with a DES to RCA. An echo during admission showed LVEF of 45-50%. At a follow-up cardiology visit on 11/23/24, the patient reported difficulty with the affordability of medications. Long-term plan of transitioning to [place] once cost is addressed by pharmacy.   At today's visit, the patient presents in good spirits and is accompanied by his significant other. He states that he has been doing well since his hospitalization, but he ran out of all of his medications approximately 1 week ago. He does have issues with the affordability of his medications since he is uninsured.   Family/Social History:  Fhx: CAD, T2DM Tobacco: current 0.3 PPD smoker   Alcohol: none reported  Current diabetes medications include: Metformin  500 mg XR tablets - takes 2 tablets (1000 mg) BID Jardiance  10 mg daily Current hypertension medications include:  Losartan  25 mg daily Metoprolol  succinate 25 mg daily Current hyperlipidemia medications include:  Atorvastatin  80 mg daily  Patient reports adherence to taking all medications as prescribed.   Insurance coverage: self-pay  Patient denies hypoglycemic events.  Reported home fasting blood sugars: reports in range  Patient denies polyuria, polydipsia. Patient reports neuropathy (nerve pain). Patient denies visual changes. Patient reports self foot exams.   Patient reported dietary habits: Eats 1 meals/day Dinner: usually eats his 1 meal after work  Snacks:  tries to limit snacking.  He has cut out rice, potatoes, pasta. Has eliminated for bread. Tries to stick to lean proteins and vegetables -- grilled chicken salads, beef, corn, green beans  Drinks: drinks coffee w/ creamer (Zero Sugar), admits to not drinking water   Patient-reported exercise habits:  Works at a radio broadcast assistant; pulls orders.  Lots of weight bearing exercise  O:  Lab Results  Component Value Date   HGBA1C 7.2 (H) 11/10/2024   There were no vitals filed for this visit.  Lipid Panel     Component Value Date/Time   CHOL 147 11/23/2024 1236   TRIG 116 11/23/2024 1236   HDL 35 (L) 11/23/2024 1236   CHOLHDL 4.2 11/23/2024 1236   CHOLHDL 5.5 11/09/2024 1424   VLDL 26 11/09/2024 1424   LDLCALC 91 11/23/2024 1236   Clinical Atherosclerotic Cardiovascular Disease (ASCVD): Yes: STEMI (10/2024)  Patient is participating in a Managed Medicaid Plan: no   A/P: Diabetes is longstanding and currently above goal given the last A1c of 7.2% on 11/10/24, decreased from 9.6% on 09/01/24. After his STEMI, Jardiance  was added for cardiorenal protection. He has been out of both metformin  and Jardiance  for ~1 week. Will need to transition from Jardiance  to Farxiga  to enroll him in AZ&Me PAP. Since he is post-ASCVD event, we will consider GLPRA at follow-up. We opted to minimize medication changes during the visit due to the recent event and new medication burden. The patient is not currently hypoglycemic but is able to verbalize an appropriate hypoglycemia management plan.  Continue metformin  1,000 mg twice daily Will transition from Jardiance  to Farxiga  10 mg daily  Rx  sent to [facility] with patient instructions to fill out patient assistance forms today.  Extensively discussed pathophysiology of diabetes, recommended lifestyle interventions, dietary effects on blood sugar control.  Counseled on s/sx of and management of hypoglycemia.  Next A1c anticipated 01/2025.   Post-ASCVD Event (STEMI  10/2024): The patient reports that he has been out of all medications for approximately one week, including all post-STEMI meds (e.g., antiplatelets, BB, ARB, and statin). During the visit, he reports issues with the affordability of medications since he is uninsured. Will refill all medications today. Unfortunately, prasugrel  is a high-cost medication and not available at [facility]. He will need to be transitioned to clopidogrel  75 mg daily due to affordability concerns and to continue DAPT, as prescribed by cardiology. Prescriptions sent today.  -Extensively discussed recommended medication post STEMI.   Written patient instructions provided. Patient verbalized understanding of treatment plan.   Total time in face to face counseling 45 minutes.    Follow-up:  PCP: 01/03/25 PharmD: 01/17/25  Woodie Jock, PharmD PGY1 Pharmacy Resident  12/13/2024  Herlene Fleeta Morris, PharmD, BCACP, CPP Clinical Pharmacist [facility] [phone]   "

## 2024-12-13 ENCOUNTER — Ambulatory Visit: Payer: Self-pay | Attending: Family Medicine | Admitting: Pharmacist

## 2024-12-13 ENCOUNTER — Other Ambulatory Visit: Payer: Self-pay

## 2024-12-13 ENCOUNTER — Encounter: Payer: Self-pay | Admitting: Pharmacist

## 2024-12-13 DIAGNOSIS — Z7984 Long term (current) use of oral hypoglycemic drugs: Secondary | ICD-10-CM

## 2024-12-13 DIAGNOSIS — I2119 ST elevation (STEMI) myocardial infarction involving other coronary artery of inferior wall: Secondary | ICD-10-CM

## 2024-12-13 DIAGNOSIS — E1169 Type 2 diabetes mellitus with other specified complication: Secondary | ICD-10-CM

## 2024-12-13 MED ORDER — METOPROLOL SUCCINATE ER 25 MG PO TB24
25.0000 mg | ORAL_TABLET | Freq: Every day | ORAL | 1 refills | Status: AC
  Filled 2024-12-13: qty 30, 30d supply, fill #0

## 2024-12-13 MED ORDER — DAPAGLIFLOZIN PROPANEDIOL 10 MG PO TABS
10.0000 mg | ORAL_TABLET | Freq: Every day | ORAL | 3 refills | Status: AC
  Filled 2024-12-13: qty 15, 15d supply, fill #0

## 2024-12-13 MED ORDER — ATORVASTATIN CALCIUM 80 MG PO TABS
80.0000 mg | ORAL_TABLET | Freq: Every day | ORAL | 1 refills | Status: AC
  Filled 2024-12-13: qty 30, 30d supply, fill #0

## 2024-12-13 MED ORDER — LOSARTAN POTASSIUM 25 MG PO TABS
25.0000 mg | ORAL_TABLET | Freq: Every evening | ORAL | 1 refills | Status: AC
  Filled 2024-12-13: qty 30, 30d supply, fill #0

## 2024-12-13 MED ORDER — METFORMIN HCL ER 500 MG PO TB24
1000.0000 mg | ORAL_TABLET | Freq: Two times a day (BID) | ORAL | 3 refills | Status: AC
  Filled 2024-12-13: qty 360, 90d supply, fill #0

## 2024-12-13 MED ORDER — CLOPIDOGREL BISULFATE 75 MG PO TABS
75.0000 mg | ORAL_TABLET | Freq: Every day | ORAL | 2 refills | Status: AC
  Filled 2024-12-13: qty 90, 90d supply, fill #0

## 2024-12-13 MED ORDER — ASPIRIN 81 MG PO CHEW
81.0000 mg | CHEWABLE_TABLET | Freq: Every day | ORAL | 2 refills | Status: AC
  Filled 2024-12-13: qty 90, 90d supply, fill #0

## 2024-12-13 NOTE — Patient Instructions (Signed)
 Transition from prasugrel  to clopidogrel  75 mg daily Fill out patient assistance forms for Farxiga  10 mg daily at the pharmacy downstairs If you have any questions or concerns, please call the clinic

## 2024-12-15 ENCOUNTER — Ambulatory Visit: Payer: Self-pay | Admitting: Family

## 2024-12-15 ENCOUNTER — Ambulatory Visit (INDEPENDENT_AMBULATORY_CARE_PROVIDER_SITE_OTHER): Payer: Self-pay | Admitting: Family

## 2024-12-15 ENCOUNTER — Ambulatory Visit (INDEPENDENT_AMBULATORY_CARE_PROVIDER_SITE_OTHER): Payer: Self-pay

## 2024-12-15 VITALS — BP 121/73 | HR 79 | Ht 73.0 in | Wt 202.0 lb

## 2024-12-15 DIAGNOSIS — R0789 Other chest pain: Secondary | ICD-10-CM

## 2024-12-15 DIAGNOSIS — F17200 Nicotine dependence, unspecified, uncomplicated: Secondary | ICD-10-CM

## 2024-12-15 DIAGNOSIS — Z09 Encounter for follow-up examination after completed treatment for conditions other than malignant neoplasm: Secondary | ICD-10-CM

## 2024-12-15 DIAGNOSIS — E119 Type 2 diabetes mellitus without complications: Secondary | ICD-10-CM

## 2024-12-15 DIAGNOSIS — G4700 Insomnia, unspecified: Secondary | ICD-10-CM

## 2024-12-15 DIAGNOSIS — R200 Anesthesia of skin: Secondary | ICD-10-CM

## 2024-12-15 DIAGNOSIS — E785 Hyperlipidemia, unspecified: Secondary | ICD-10-CM

## 2024-12-15 DIAGNOSIS — I255 Ischemic cardiomyopathy: Secondary | ICD-10-CM

## 2024-12-15 DIAGNOSIS — I213 ST elevation (STEMI) myocardial infarction of unspecified site: Secondary | ICD-10-CM

## 2024-12-15 DIAGNOSIS — E118 Type 2 diabetes mellitus with unspecified complications: Secondary | ICD-10-CM

## 2024-12-15 DIAGNOSIS — Z0189 Encounter for other specified special examinations: Secondary | ICD-10-CM

## 2024-12-15 DIAGNOSIS — R5383 Other fatigue: Secondary | ICD-10-CM

## 2024-12-15 DIAGNOSIS — E559 Vitamin D deficiency, unspecified: Secondary | ICD-10-CM

## 2024-12-15 LAB — POCT RAPID STREP A (OFFICE): Rapid Strep A Screen: NEGATIVE

## 2024-12-15 NOTE — Progress Notes (Signed)
 "   Patient ID: Herbert Barnes, male    DOB: 01-12-1976  MRN: [id number]  CC: Hospital Discharge Follow-Up  Subjective: Herbert Barnes is a 49 y.o. male who presents for hospital discharge follow-up. Patient accompanied by his significant other.  His concerns today include:  - Patient seen on 11/09/2024 - 11/11/2024 (2 days) at [facility] for ST elevation myocardial infarction (STEMI) of inferior wall, initial episode of care [facility]) and additional diagnoses. Today patient reports feeling improved and denies red flag symptoms. He is doing well on medication regimen, no issues/concerns. He was recently seen on 11/23/2024 at [facility] at [facility] [employer] for follow-up of chronic conditions. States he needs lab to check kidneys and liver before his upcoming echocardiogram appointment.  - Due for diabetic eye exam.  - Due for diabetic foot exam. - States numbness of left hand (pinky side of hand). Denies recent trauma/injury and red flag symptoms.  - Fatigue.  - Insomnia.  - Chest discomfort. Denies red flag symptoms.   Patient Active Problem List   Diagnosis Date Noted   Hyperlipidemia 11/10/2024   ST elevation myocardial infarction (STEMI) of inferior wall, initial episode of care [facility]) 11/09/2024   Type 2 diabetes mellitus with complication, without long-term current use of insulin  (HCC) 03/08/2015     Medications Ordered Prior to Encounter[1]  Allergies[2]  Social History   Socioeconomic History   Marital status: Legally Separated    Spouse name: Not on file   Number of children: Not on file   Years of education: Not on file   Highest education level: Not on file  Occupational History   Not on file  Tobacco Use   Smoking status: Every Day    Current packs/day: 0.25    Average packs/day: 0.3 packs/day for 27.0 years (6.8 ttl pk-yrs)    Types: Cigarettes   Smokeless tobacco: Never  Vaping Use   Vaping status: Former  Substance and  Sexual Activity   Alcohol use: Yes    Comment: rarely   Drug use: No   Sexual activity: Not on file  Other Topics Concern   Not on file  Social History Narrative   Not on file   Social Drivers of Health   Tobacco Use: High Risk (12/13/2024)   Patient History    Smoking Tobacco Use: Every Day    Smokeless Tobacco Use: Never    Passive Exposure: Not on file  Financial Resource Strain: Not on file  Food Insecurity: No Food Insecurity (11/09/2024)   Epic    Worried About Programme Researcher, Broadcasting/film/video in the Last Year: Never true    Ran Out of Food in the Last Year: Never true  Transportation Needs: No Transportation Needs (11/09/2024)   Epic    Lack of Transportation (Medical): No    Lack of Transportation (Non-Medical): No  Physical Activity: Not on file  Stress: Not on file  Social Connections: Not on file  Intimate Partner Violence: Not At Risk (11/09/2024)   Epic    Fear of Current or Ex-Partner: No    Emotionally Abused: No    Physically Abused: No    Sexually Abused: No  Depression (PHQ2-9): Not on file  Alcohol Screen: Not on file  Housing: Low Risk (11/09/2024)   Epic    Unable to Pay for Housing in the Last Year: No    Number of Times Moved in the Last Year: 0  Homeless in the Last Year: No  Utilities: Not At Risk (11/09/2024)   Epic    Threatened with loss of utilities: No  Health Literacy: Not on file    Family History  Problem Relation Age of Onset   CAD Mother    Diabetes type II Father     Past Surgical History:  Procedure Laterality Date   CORONARY/GRAFT ACUTE MI REVASCULARIZATION N/A 11/09/2024   Procedure: Coronary/Graft Acute MI Revascularization;  Surgeon: Anner Alm ORN, MD;  Location: [facility] INVASIVE CV LAB;  Service: Cardiovascular;  Laterality: N/A;   HAND SURGERY     LEFT HEART CATH AND CORONARY ANGIOGRAPHY N/A 11/09/2024   Procedure: LEFT HEART CATH AND CORONARY ANGIOGRAPHY;  Surgeon: Anner Alm ORN, MD;  Location: [facility] INVASIVE CV LAB;  Service:  Cardiovascular;  Laterality: N/A;    ROS: Review of Systems Negative except as stated above  PHYSICAL EXAM: BP 121/73   Pulse 79   Ht 6' 1 (1.854 m)   Wt 202 lb (91.6 kg)   SpO2 97%   BMI 26.65 kg/m   Physical Exam HENT:     Head: Normocephalic and atraumatic.     Nose: Nose normal.     Mouth/Throat:     Mouth: Mucous membranes are moist.     Pharynx: Oropharynx is clear.  Eyes:     Extraocular Movements: Extraocular movements intact.     Conjunctiva/sclera: Conjunctivae normal.     Pupils: Pupils are equal, round, and reactive to light.  Cardiovascular:     Rate and Rhythm: Normal rate and regular rhythm.     Pulses: Normal pulses.     Heart sounds: Normal heart sounds.  Pulmonary:     Effort: Pulmonary effort is normal.     Breath sounds: Normal breath sounds.  Musculoskeletal:        General: Normal range of motion.     Right shoulder: Normal.     Left shoulder: Normal.     Right upper arm: Normal.     Left upper arm: Normal.     Right elbow: Normal.     Left elbow: Normal.     Right forearm: Normal.     Left forearm: Normal.     Right wrist: Normal.     Left wrist: Normal.     Right hand: Normal.     Left hand: Normal.     Cervical back: Normal range of motion and neck supple.  Neurological:     General: No focal deficit present.     Mental Status: He is alert and oriented to person, place, and time.  Psychiatric:        Mood and Affect: Mood normal.        Behavior: Behavior normal.     ASSESSMENT AND PLAN: 1. Hospital discharge follow-up (Primary) - Reviewed hospital course, current medications, ensured proper follow-up in place, and addressed concerns.  - Routine screening.  - CMP14+EGFR  2. ST elevation myocardial infarction (STEMI), unspecified artery (HCC) 3. Ischemic cardiomyopathy - Continue present management as managed per established Cardiology.  - Keep all scheduled appointments with established Cardiology.  - Follow-up with primary  provider as scheduled.   4. Type 2 diabetes mellitus with complication, without long-term current use of insulin  (HCC) - Hemoglobin A1c 7.2% on 11/10/2024. - Continue present management.  - Routine screening.  - Discussed the importance of healthy eating habits, low-carbohydrate diet, low-sugar diet, regular aerobic exercise (at least 150 minutes a week as tolerated) and medication  compliance to achieve or maintain control of diabetes. Counseled on medication adherence/adverse effects.  - Follow-up with primary provider in 4 weeks or sooner if needed. - Microalbumin / creatinine urine ratio  5. Diabetic eye exam [facility]) - Referral to Ophthalmology for evaluation/management.  - Ambulatory referral to Ophthalmology  6. Encounter for diabetic foot exam [facility]) - Referral to Podiatry for evaluation/management.  - Ambulatory referral to Podiatry  7. Hyperlipidemia, unspecified hyperlipidemia type - Continue present management as managed per established Cardiology.  - Keep all scheduled appointments with established Cardiology.  - Follow-up with primary provider as scheduled.   8. Tobacco use disorder - Counseled to quit.   - Follow-up with primary provider as scheduled.   9. Numbness of left hand - Patient declined pharmacological therapy.  - Patient declined referral to specialist.  - Follow-up with primary provider as scheduled.   10. Fatigue, unspecified type - Routine screening.  - Vitamin D , 25-hydroxy  11. Insomnia, unspecified type - Patient declined pharmacological therapy.  - Follow-up with primary provider as scheduled.  12. Chest discomfort - Patient today in office with no cardiopulmonary/acute distress.  - Routine screening.  - Follow-up with primary provider as scheduled.  - DG Chest 2 View; Future - COVID-19, Flu A+B and RSV - POCT rapid strep A; Future - Culture, Group A Strep - POCT rapid strep A   Patient was given the opportunity to ask questions.  Patient  verbalized understanding of the plan and was able to repeat key elements of the plan. Patient was given clear instructions to go to Emergency Department or return to medical center if symptoms don't improve, worsen, or new problems develop.The patient verbalized understanding.   Orders Placed This Encounter  Procedures   COVID-19, Flu A+B and RSV   Culture, Group A Strep   DG Chest 2 View   Microalbumin / creatinine urine ratio   CMP14+EGFR   Vitamin D , 25-hydroxy   Ambulatory referral to Podiatry   Ambulatory referral to Ophthalmology   POCT rapid strep A    Return for Follow-Up or next available with Raguel Blush, MD.  Greig JINNY Chute, NP      [1]  Current Outpatient Medications on File Prior to Visit  Medication Sig Dispense Refill   aspirin  81 MG chewable tablet Chew 1 tablet (81 mg total) by mouth daily. 90 tablet 2   atorvastatin  (LIPITOR) 80 MG tablet Take 1 tablet (80 mg total) by mouth daily. 30 tablet 1   Blood Glucose Monitoring Suppl (TRUE METRIX METER) w/Device KIT Use to check blood sugar 3 times daily. 1 kit 0   clopidogrel  (PLAVIX ) 75 MG tablet Take 1 tablet (75 mg total) by mouth daily. 90 tablet 2   dapagliflozin  propanediol (FARXIGA ) 10 MG TABS tablet Take 1 tablet (10 mg total) by mouth daily. 90 tablet 3   fluticasone  (FLONASE ) 50 MCG/ACT nasal spray Place 2 sprays into both nostrils daily. 15.8 mL 0   glucose blood (TRUE METRIX BLOOD GLUCOSE TEST) test strip Use to check blood sugar 3 times daily. 100 each 6   losartan  (COZAAR ) 25 MG tablet Take 1 tablet (25 mg total) by mouth every evening. 30 tablet 1   metFORMIN  (GLUCOPHAGE -XR) 500 MG 24 hr tablet Take 2 tablets (1,000 mg total) by mouth 2 (two) times daily with a meal. 360 tablet 3   metoprolol  succinate (TOPROL -XL) 25 MG 24 hr tablet Take 1 tablet (25 mg total) by mouth daily. Take with or immediately following a meal.  30 tablet 1   nitroGLYCERIN  (NITROSTAT ) 0.4 MG SL tablet Place 1 tablet (0.4 mg total)  under the tongue every 5 (five) minutes as needed for chest pain. 25 tablet 1   TRUEplus Lancets 28G MISC Use to check blood sugar 3 times daily. 100 each 6   No current facility-administered medications on file prior to visit.  [2]  Allergies Allergen Reactions   Codeine Other (See Comments)    threw my head for a loop   "

## 2024-12-16 ENCOUNTER — Other Ambulatory Visit: Payer: Self-pay

## 2024-12-16 LAB — COVID-19, FLU A+B AND RSV
Influenza A, NAA: NOT DETECTED
Influenza B, NAA: NOT DETECTED
RSV, NAA: NOT DETECTED
SARS-CoV-2, NAA: NOT DETECTED

## 2024-12-16 LAB — CMP14+EGFR
ALT: 15 IU/L (ref 0–44)
AST: 16 IU/L (ref 0–40)
Albumin: 4.3 g/dL (ref 4.1–5.1)
Alkaline Phosphatase: 87 IU/L (ref 47–123)
BUN/Creatinine Ratio: 13 (ref 9–20)
BUN: 13 mg/dL (ref 6–24)
Bilirubin Total: 0.4 mg/dL (ref 0.0–1.2)
CO2: 22 mmol/L (ref 20–29)
Calcium: 9.7 mg/dL (ref 8.7–10.2)
Chloride: 100 mmol/L (ref 96–106)
Creatinine, Ser: 0.98 mg/dL (ref 0.76–1.27)
Globulin, Total: 3 g/dL (ref 1.5–4.5)
Glucose: 123 mg/dL — ABNORMAL HIGH (ref 70–99)
Potassium: 4.5 mmol/L (ref 3.5–5.2)
Sodium: 138 mmol/L (ref 134–144)
Total Protein: 7.3 g/dL (ref 6.0–8.5)
eGFR: 95 mL/min/1.73

## 2024-12-16 LAB — VITAMIN D 25 HYDROXY (VIT D DEFICIENCY, FRACTURES): Vit D, 25-Hydroxy: 23.4 ng/mL — ABNORMAL LOW (ref 30.0–100.0)

## 2024-12-16 LAB — MICROALBUMIN / CREATININE URINE RATIO
Creatinine, Urine: 82.5 mg/dL
Microalb/Creat Ratio: 31 mg/g{creat} — ABNORMAL HIGH (ref 0–29)
Microalbumin, Urine: 25.7 ug/mL

## 2024-12-17 ENCOUNTER — Other Ambulatory Visit: Payer: Self-pay

## 2024-12-17 LAB — CULTURE, GROUP A STREP: Strep A Culture: NEGATIVE

## 2024-12-17 MED ORDER — VITAMIN D (ERGOCALCIFEROL) 1.25 MG (50000 UNIT) PO CAPS
50000.0000 [IU] | ORAL_CAPSULE | ORAL | 0 refills | Status: AC
  Filled 2024-12-17: qty 12, 84d supply, fill #0

## 2024-12-20 ENCOUNTER — Other Ambulatory Visit: Payer: Self-pay

## 2024-12-22 ENCOUNTER — Telehealth (HOSPITAL_COMMUNITY): Payer: Self-pay

## 2024-12-22 ENCOUNTER — Other Ambulatory Visit: Payer: Self-pay

## 2024-12-22 NOTE — Telephone Encounter (Signed)
 Attempted f/u call regarding cardiac rehab- no answer, unable to leave message.  Closing referral.

## 2024-12-27 ENCOUNTER — Other Ambulatory Visit: Payer: Self-pay

## 2024-12-29 ENCOUNTER — Other Ambulatory Visit: Payer: Self-pay

## 2025-01-03 ENCOUNTER — Ambulatory Visit: Payer: Self-pay | Admitting: Family Medicine

## 2025-01-13 ENCOUNTER — Ambulatory Visit (HOSPITAL_COMMUNITY): Payer: Self-pay

## 2025-01-17 ENCOUNTER — Ambulatory Visit: Payer: Self-pay | Admitting: Pharmacist

## 2025-03-15 ENCOUNTER — Ambulatory Visit: Payer: Self-pay | Admitting: Family Medicine
